# Patient Record
Sex: Male | Born: 1935 | Race: Black or African American | Hispanic: No | Marital: Married | Smoking: Former smoker
Health system: Southern US, Community
[De-identification: ages and names within clinical notes are randomized; demographics above are authoritative.]

## PROBLEM LIST (undated history)

## (undated) DIAGNOSIS — H409 Unspecified glaucoma: Secondary | ICD-10-CM

## (undated) DIAGNOSIS — E7800 Pure hypercholesterolemia, unspecified: Secondary | ICD-10-CM

## (undated) DIAGNOSIS — I10 Essential (primary) hypertension: Secondary | ICD-10-CM

## (undated) DIAGNOSIS — I639 Cerebral infarction, unspecified: Secondary | ICD-10-CM

## (undated) DIAGNOSIS — I2699 Other pulmonary embolism without acute cor pulmonale: Secondary | ICD-10-CM

## (undated) DIAGNOSIS — E119 Type 2 diabetes mellitus without complications: Secondary | ICD-10-CM

## (undated) DIAGNOSIS — F0390 Unspecified dementia without behavioral disturbance: Secondary | ICD-10-CM

## (undated) DIAGNOSIS — F4322 Adjustment disorder with anxiety: Secondary | ICD-10-CM

## (undated) DIAGNOSIS — R569 Unspecified convulsions: Secondary | ICD-10-CM

## (undated) DIAGNOSIS — Z950 Presence of cardiac pacemaker: Secondary | ICD-10-CM

## (undated) HISTORY — PX: AORTIC VALVE REPLACEMENT (AVR)/CORONARY ARTERY BYPASS GRAFTING (CABG): SHX5725

---

## 2005-01-03 ENCOUNTER — Other Ambulatory Visit: Payer: Self-pay

## 2005-01-03 ENCOUNTER — Inpatient Hospital Stay: Payer: Self-pay | Admitting: Internal Medicine

## 2005-06-04 ENCOUNTER — Inpatient Hospital Stay: Payer: Self-pay | Admitting: Surgery

## 2005-12-13 ENCOUNTER — Inpatient Hospital Stay: Payer: Self-pay | Admitting: Internal Medicine

## 2005-12-13 ENCOUNTER — Other Ambulatory Visit: Payer: Self-pay

## 2008-07-09 ENCOUNTER — Ambulatory Visit: Payer: Self-pay | Admitting: Internal Medicine

## 2008-08-02 ENCOUNTER — Ambulatory Visit: Payer: Self-pay | Admitting: Ophthalmology

## 2009-04-13 ENCOUNTER — Emergency Department: Payer: Self-pay | Admitting: Emergency Medicine

## 2012-11-23 ENCOUNTER — Emergency Department: Payer: Self-pay | Admitting: Emergency Medicine

## 2012-11-23 LAB — COMPREHENSIVE METABOLIC PANEL
Albumin: 3.7 g/dL (ref 3.4–5.0)
Alkaline Phosphatase: 53 U/L (ref 50–136)
Anion Gap: 6 — ABNORMAL LOW (ref 7–16)
BUN: 15 mg/dL (ref 7–18)
Bilirubin,Total: 0.4 mg/dL (ref 0.2–1.0)
Co2: 26 mmol/L (ref 21–32)
Creatinine: 1.1 mg/dL (ref 0.60–1.30)
EGFR (Non-African Amer.): >60
Glucose: 84 mg/dL (ref 65–99)
Potassium: 4 mmol/L (ref 3.5–5.1)
SGOT(AST): 19 U/L (ref 15–37)
SGPT (ALT): 28 U/L (ref 12–78)
Sodium: 138 mmol/L (ref 136–145)
Total Protein: 6.9 g/dL (ref 6.4–8.2)

## 2012-11-23 LAB — CBC
HCT: 41.7 % (ref 40.0–52.0)
HGB: 14 g/dL (ref 13.0–18.0)
MCHC: 33.6 g/dL (ref 32.0–36.0)
MCV: 87 fL (ref 80–100)
RDW: 14.2 % (ref 11.5–14.5)
WBC: 5.2 10*3/uL (ref 3.8–10.6)

## 2012-11-23 LAB — CK TOTAL AND CKMB (NOT AT ARMC)
CK, Total: 156 U/L (ref 35–232)
CK-MB: 1.7 ng/mL (ref 0.5–3.6)

## 2012-11-23 LAB — PRO B NATRIURETIC PEPTIDE: B-Type Natriuretic Peptide: 71 pg/mL (ref 0–125)

## 2012-11-23 LAB — TROPONIN I: Troponin-I: <0.02 ng/mL

## 2012-11-29 ENCOUNTER — Ambulatory Visit: Payer: Self-pay | Admitting: Cardiology

## 2013-02-06 ENCOUNTER — Encounter: Payer: Self-pay | Admitting: Surgery

## 2013-03-08 ENCOUNTER — Encounter: Payer: Self-pay | Admitting: Surgery

## 2013-03-19 ENCOUNTER — Ambulatory Visit: Payer: Self-pay | Admitting: Physician Assistant

## 2013-04-08 ENCOUNTER — Encounter: Payer: Self-pay | Admitting: Surgery

## 2013-07-25 ENCOUNTER — Ambulatory Visit: Payer: Self-pay | Admitting: Gastroenterology

## 2013-07-26 LAB — PATHOLOGY REPORT

## 2014-02-22 LAB — URINALYSIS, COMPLETE
BACTERIA: NONE SEEN
Bilirubin,UR: NEGATIVE
GLUCOSE, UR: NEGATIVE mg/dL (ref 0–75)
KETONE: NEGATIVE
LEUKOCYTE ESTERASE: NEGATIVE
NITRITE: NEGATIVE
PH: 5 (ref 4.5–8.0)
Protein: NEGATIVE
SQUAMOUS EPITHELIAL: NONE SEEN
Specific Gravity: 1.023 (ref 1.003–1.030)

## 2014-02-22 LAB — CBC WITH DIFFERENTIAL/PLATELET
Basophil #: 0.1 10*3/uL (ref 0.0–0.1)
Basophil %: 1.2 %
Eosinophil #: 0 10*3/uL (ref 0.0–0.7)
Eosinophil %: 1.1 %
HCT: 46.1 % (ref 40.0–52.0)
HGB: 14.6 g/dL (ref 13.0–18.0)
Lymphocyte #: 1.9 10*3/uL (ref 1.0–3.6)
Lymphocyte %: 42.6 %
MCH: 27.5 pg (ref 26.0–34.0)
MCHC: 31.7 g/dL — AB (ref 32.0–36.0)
MCV: 87 fL (ref 80–100)
Monocyte #: 0.4 x10 3/mm (ref 0.2–1.0)
Monocyte %: 8.4 %
NEUTROS ABS: 2.1 10*3/uL (ref 1.4–6.5)
NEUTROS PCT: 46.7 %
PLATELETS: 156 10*3/uL (ref 150–440)
RBC: 5.3 10*6/uL (ref 4.40–5.90)
RDW: 13.6 % (ref 11.5–14.5)
WBC: 4.5 10*3/uL (ref 3.8–10.6)

## 2014-02-22 LAB — HEPATIC FUNCTION PANEL A (ARMC)
ALK PHOS: 56 U/L
ALT: 28 U/L (ref 12–78)
Albumin: 3.9 g/dL (ref 3.4–5.0)
BILIRUBIN TOTAL: 0.7 mg/dL (ref 0.2–1.0)
Bilirubin, Direct: 0.1 mg/dL (ref 0.00–0.20)
SGOT(AST): 21 U/L (ref 15–37)
TOTAL PROTEIN: 7.6 g/dL (ref 6.4–8.2)

## 2014-02-22 LAB — BASIC METABOLIC PANEL
ANION GAP: 5 — AB (ref 7–16)
BUN: 10 mg/dL (ref 7–18)
CALCIUM: 8.9 mg/dL (ref 8.5–10.1)
Chloride: 104 mmol/L (ref 98–107)
Co2: 28 mmol/L (ref 21–32)
Creatinine: 0.96 mg/dL (ref 0.60–1.30)
EGFR (African American): >60
EGFR (Non-African Amer.): >60
Glucose: 85 mg/dL (ref 65–99)
Osmolality: 272 (ref 275–301)
POTASSIUM: 4.2 mmol/L (ref 3.5–5.1)
Sodium: 137 mmol/L (ref 136–145)

## 2014-02-22 LAB — LIPASE, BLOOD: LIPASE: 690 U/L — AB (ref 73–393)

## 2014-02-22 LAB — TROPONIN I: Troponin-I: <0.02 ng/mL

## 2014-02-22 LAB — MAGNESIUM: MAGNESIUM: 2 mg/dL

## 2014-02-23 ENCOUNTER — Inpatient Hospital Stay: Payer: Self-pay | Admitting: Family Medicine

## 2014-02-23 LAB — LIPID PANEL
CHOLESTEROL: 245 mg/dL — AB (ref 0–200)
HDL: 66 mg/dL — AB (ref 40–60)
LDL CHOLESTEROL, CALC: 165 mg/dL — AB (ref 0–100)
Triglycerides: 69 mg/dL (ref 0–200)
VLDL CHOLESTEROL, CALC: 14 mg/dL (ref 5–40)

## 2014-02-23 LAB — LIPASE, BLOOD: Lipase: 139 U/L (ref 73–393)

## 2014-02-23 LAB — HEMOGLOBIN A1C: Hemoglobin A1C: 6 % (ref 4.2–6.3)

## 2014-02-24 LAB — BASIC METABOLIC PANEL
Anion Gap: 7 (ref 7–16)
BUN: 14 mg/dL (ref 7–18)
CALCIUM: 8.6 mg/dL (ref 8.5–10.1)
Chloride: 104 mmol/L (ref 98–107)
Co2: 26 mmol/L (ref 21–32)
Creatinine: 0.95 mg/dL (ref 0.60–1.30)
EGFR (Non-African Amer.): >60
Glucose: 99 mg/dL (ref 65–99)
OSMOLALITY: 274 (ref 275–301)
Potassium: 3.5 mmol/L (ref 3.5–5.1)
SODIUM: 137 mmol/L (ref 136–145)

## 2014-02-24 LAB — CBC WITH DIFFERENTIAL/PLATELET
BASOS PCT: 0.1 %
Basophil #: 0 10*3/uL (ref 0.0–0.1)
EOS ABS: 0 10*3/uL (ref 0.0–0.7)
Eosinophil %: 0.1 %
HCT: 41.5 % (ref 40.0–52.0)
HGB: 13.5 g/dL (ref 13.0–18.0)
Lymphocyte #: 2.2 10*3/uL (ref 1.0–3.6)
Lymphocyte %: 23.1 %
MCH: 28.3 pg (ref 26.0–34.0)
MCHC: 32.6 g/dL (ref 32.0–36.0)
MCV: 87 fL (ref 80–100)
MONO ABS: 0.7 x10 3/mm (ref 0.2–1.0)
Monocyte %: 7.3 %
NEUTROS PCT: 69.4 %
Neutrophil #: 6.6 10*3/uL — ABNORMAL HIGH (ref 1.4–6.5)
Platelet: 162 10*3/uL (ref 150–440)
RBC: 4.77 10*6/uL (ref 4.40–5.90)
RDW: 13.8 % (ref 11.5–14.5)
WBC: 9.6 10*3/uL (ref 3.8–10.6)

## 2014-03-19 ENCOUNTER — Encounter: Payer: Self-pay | Admitting: Neurology

## 2014-03-25 DIAGNOSIS — I10 Essential (primary) hypertension: Secondary | ICD-10-CM | POA: Insufficient documentation

## 2014-03-25 DIAGNOSIS — R0602 Shortness of breath: Secondary | ICD-10-CM | POA: Insufficient documentation

## 2014-03-25 DIAGNOSIS — E785 Hyperlipidemia, unspecified: Secondary | ICD-10-CM | POA: Insufficient documentation

## 2014-04-08 ENCOUNTER — Encounter: Payer: Self-pay | Admitting: Neurology

## 2014-04-20 ENCOUNTER — Inpatient Hospital Stay: Payer: Self-pay

## 2014-04-20 LAB — CBC WITH DIFFERENTIAL/PLATELET
BASOS ABS: 0.1 10*3/uL (ref 0.0–0.1)
Basophil %: 1.3 %
EOS PCT: 1.3 %
Eosinophil #: 0.1 10*3/uL (ref 0.0–0.7)
HCT: 43.6 % (ref 40.0–52.0)
HGB: 14.8 g/dL (ref 13.0–18.0)
Lymphocyte #: 1.9 10*3/uL (ref 1.0–3.6)
Lymphocyte %: 42.9 %
MCH: 29.7 pg (ref 26.0–34.0)
MCHC: 33.9 g/dL (ref 32.0–36.0)
MCV: 88 fL (ref 80–100)
Monocyte #: 0.4 x10 3/mm (ref 0.2–1.0)
Monocyte %: 9.8 %
NEUTROS ABS: 1.9 10*3/uL (ref 1.4–6.5)
NEUTROS PCT: 44.7 %
PLATELETS: 188 10*3/uL (ref 150–440)
RBC: 4.98 10*6/uL (ref 4.40–5.90)
RDW: 14.1 % (ref 11.5–14.5)
WBC: 4.3 10*3/uL (ref 3.8–10.6)

## 2014-04-20 LAB — COMPREHENSIVE METABOLIC PANEL
ALK PHOS: 70 U/L
Albumin: 3.8 g/dL (ref 3.4–5.0)
Anion Gap: 8 (ref 7–16)
BILIRUBIN TOTAL: 0.6 mg/dL (ref 0.2–1.0)
BUN: 10 mg/dL (ref 7–18)
Calcium, Total: 8.8 mg/dL (ref 8.5–10.1)
Chloride: 101 mmol/L (ref 98–107)
Co2: 28 mmol/L (ref 21–32)
Creatinine: 1.14 mg/dL (ref 0.60–1.30)
EGFR (African American): >60
EGFR (Non-African Amer.): >60
GLUCOSE: 80 mg/dL (ref 65–99)
Osmolality: 272 (ref 275–301)
POTASSIUM: 4.2 mmol/L (ref 3.5–5.1)
SGOT(AST): 33 U/L (ref 15–37)
SGPT (ALT): 36 U/L (ref 12–78)
Sodium: 137 mmol/L (ref 136–145)
TOTAL PROTEIN: 7.2 g/dL (ref 6.4–8.2)

## 2014-04-20 LAB — URINALYSIS, COMPLETE
BILIRUBIN, UR: NEGATIVE
Bacteria: NONE SEEN
Blood: NEGATIVE
Glucose,UR: NEGATIVE mg/dL (ref 0–75)
Hyaline Cast: 2
KETONE: NEGATIVE
LEUKOCYTE ESTERASE: NEGATIVE
Nitrite: NEGATIVE
Ph: 5 (ref 4.5–8.0)
Protein: NEGATIVE
RBC,UR: NONE SEEN /HPF (ref 0–5)
Specific Gravity: 1.016 (ref 1.003–1.030)
Squamous Epithelial: <1

## 2014-04-20 LAB — LIPID PANEL
Cholesterol: 206 mg/dL — ABNORMAL HIGH (ref 0–200)
HDL Cholesterol: 64 mg/dL — ABNORMAL HIGH (ref 40–60)
LDL CHOLESTEROL, CALC: 121 mg/dL — AB (ref 0–100)
TRIGLYCERIDES: 104 mg/dL (ref 0–200)
VLDL Cholesterol, Calc: 21 mg/dL (ref 5–40)

## 2014-04-20 LAB — HEMOGLOBIN A1C: HEMOGLOBIN A1C: 6.3 % (ref 4.2–6.3)

## 2014-04-20 LAB — PROTIME-INR
INR: 1.1
Prothrombin Time: 14.1 secs (ref 11.5–14.7)

## 2014-04-20 LAB — TROPONIN I

## 2014-04-21 ENCOUNTER — Ambulatory Visit: Payer: Self-pay | Admitting: Neurology

## 2014-04-21 LAB — BASIC METABOLIC PANEL
Anion Gap: 5 — ABNORMAL LOW (ref 7–16)
BUN: 12 mg/dL (ref 7–18)
CALCIUM: 8.4 mg/dL — AB (ref 8.5–10.1)
CHLORIDE: 105 mmol/L (ref 98–107)
CREATININE: 1.1 mg/dL (ref 0.60–1.30)
Co2: 25 mmol/L (ref 21–32)
EGFR (African American): >60
GLUCOSE: 83 mg/dL (ref 65–99)
OSMOLALITY: 269 (ref 275–301)
Potassium: 3.9 mmol/L (ref 3.5–5.1)
SODIUM: 135 mmol/L — AB (ref 136–145)

## 2014-04-21 LAB — CBC WITH DIFFERENTIAL/PLATELET
BASOS ABS: 0 10*3/uL (ref 0.0–0.1)
Basophil %: 0.8 %
EOS ABS: 0.1 10*3/uL (ref 0.0–0.7)
EOS PCT: 1.3 %
HCT: 40.6 % (ref 40.0–52.0)
HGB: 13.4 g/dL (ref 13.0–18.0)
LYMPHS ABS: 2.5 10*3/uL (ref 1.0–3.6)
Lymphocyte %: 45.4 %
MCH: 29 pg (ref 26.0–34.0)
MCHC: 33 g/dL (ref 32.0–36.0)
MCV: 88 fL (ref 80–100)
MONOS PCT: 11.2 %
Monocyte #: 0.6 x10 3/mm (ref 0.2–1.0)
NEUTROS ABS: 2.3 10*3/uL (ref 1.4–6.5)
Neutrophil %: 41.3 %
PLATELETS: 176 10*3/uL (ref 150–440)
RBC: 4.62 10*6/uL (ref 4.40–5.90)
RDW: 14.3 % (ref 11.5–14.5)
WBC: 5.6 10*3/uL (ref 3.8–10.6)

## 2014-04-22 ENCOUNTER — Ambulatory Visit: Payer: Self-pay | Admitting: Neurology

## 2014-05-08 ENCOUNTER — Encounter: Payer: Self-pay | Admitting: Neurology

## 2014-05-17 DIAGNOSIS — IMO0002 Reserved for concepts with insufficient information to code with codable children: Secondary | ICD-10-CM | POA: Insufficient documentation

## 2014-05-17 DIAGNOSIS — I69393 Ataxia following cerebral infarction: Secondary | ICD-10-CM | POA: Insufficient documentation

## 2014-10-12 ENCOUNTER — Emergency Department: Payer: Self-pay | Admitting: Emergency Medicine

## 2014-10-13 LAB — CBC WITH DIFFERENTIAL/PLATELET
Basophil #: 0.1 10*3/uL (ref 0.0–0.1)
Basophil %: 1.1 %
Eosinophil #: 0.1 10*3/uL (ref 0.0–0.7)
Eosinophil %: 1 %
HCT: 45.3 % (ref 40.0–52.0)
HGB: 14.7 g/dL (ref 13.0–18.0)
LYMPHS ABS: 2 10*3/uL (ref 1.0–3.6)
Lymphocyte %: 38 %
MCH: 29.1 pg (ref 26.0–34.0)
MCHC: 32.4 g/dL (ref 32.0–36.0)
MCV: 90 fL (ref 80–100)
Monocyte #: 0.6 x10 3/mm (ref 0.2–1.0)
Monocyte %: 11.4 %
Neutrophil #: 2.5 10*3/uL (ref 1.4–6.5)
Neutrophil %: 48.5 %
Platelet: 160 10*3/uL (ref 150–440)
RBC: 5.05 10*6/uL (ref 4.40–5.90)
RDW: 14.4 % (ref 11.5–14.5)
WBC: 5.2 10*3/uL (ref 3.8–10.6)

## 2014-10-13 LAB — BASIC METABOLIC PANEL
Anion Gap: 7 (ref 7–16)
BUN: 16 mg/dL (ref 7–18)
Calcium, Total: 8.7 mg/dL (ref 8.5–10.1)
Chloride: 103 mmol/L (ref 98–107)
Co2: 28 mmol/L (ref 21–32)
Creatinine: 1.34 mg/dL — ABNORMAL HIGH (ref 0.60–1.30)
EGFR (African American): >60
GFR CALC NON AF AMER: 55 — AB
Glucose: 92 mg/dL (ref 65–99)
OSMOLALITY: 277 (ref 275–301)
Potassium: 4.5 mmol/L (ref 3.5–5.1)
SODIUM: 138 mmol/L (ref 136–145)

## 2014-10-13 LAB — TROPONIN I

## 2015-03-01 NOTE — Consult Note (Signed)
PATIENT NAME:  Anthony Thornton, Anthony Thornton MR#:  [id number] DATE OF BIRTH:  Dec 08, 1935  DATE OF CONSULTATION:  04/21/2014  CONSULTING PHYSICIAN:  Pauletta Browns, MD  REASON FOR CONSULTATION:  Rule out stroke.   REPORT OF CONSULTATION: This is a 79 year old, African-American male with past medical history of coronary artery disease, status post bypass graft, dementia, recent stroke in 2015, found to have a right MCA ischemic infarct. Upon CAT scan imaging, patient has bilateral chronic infarcts. Presenting with on-and-off left upper extremity weakness lasting 7 to 12 days. The weakness comes and goes, that affects only the left upper extremity. No facial droop. No visual changes. No real weakness of the left lower extremities. Denies any fever, chills, or recent infections. Admitted for stroke work-up.   PAST MEDICAL HISTORY: History of stroke in April 2015, with left MCA infarct, hypertension, history of EtOH use, GERD, hiatal hernia, coronary artery disease, hyperlipidemia, diverticulosis, diet-controlled diabetes, and early onset dementia.   PAST SURGICAL HISTORY: Tonsillectomy, colon polypectomy, coronary artery bypass grafting.   MEDICATIONS: Include aspirin 325 at home, Aricept, atorvastatin, Lasix, and lisinopril.   SOCIAL HISTORY: Lives at home with his wife. No smoking. No EtOH or drug use.   FAMILY HISTORY: Significant for heart disease, which runs in his family.   DATA: Laboratory workup has been reviewed. CAT scan of the head:  No acute intracranial abnormality. Chronic infarcts bilateral have been found. Negative ultrasound of carotids. No significant hemodynamic stenosis.   REVIEW OF SYSTEMS:   CONSTITUTIONAL:  No fever. No chills.  EYES: No blurred vision or double vision.  ENT: No tinnitus or ear pain.  RESPIRATORY: No cough. No wheeze.  CARDIOVASCULAR: No chest pain or orthopnea.  GENITOURINARY: No nausea, no vomiting.  ENDOCRINE: No polyuria or nocturia. NEUROLOGIC:  Presenting  with left upper extremity weakness.   VITAL SIGNS: Include a temperature of 97.8, pulse 69, respirations 18, blood pressure 157/84.   NEUROLOGIC EVALUATION: The patient is awake, alert to place, and could tell me his name. Could not tell me the date, time, or location. Could not tell me how many quarters are in $1.50. Speech appears to be fluent. No signs of aphasia or dysarthria. Extraocular movements are intact. Pupils equal, round, reactive. Facial sensation intact. Facial motor intact. Tongue is midline. Uvula elevates symmetrically. Shoulder shrug intact. Motor strength appears to be 5/5 in right upper extremity, left upper extremity proximally is 4+/5, distally is 4/5. There is a definite giveaway weakness in the left upper extremity. Left lower extremity is equal to the right lower extremity. Appears to be 4+/5 in all muscle groups. Reflexes are diminished throughout. Sensation intact to light touch and temperature. Coordination: Finger-to-nose intact. Gait could not be assessed.   IMPRESSION: A 79 year old male with history of coronary artery disease, early onset Alzheimer's, on Aricept, was on aspirin, presenting with a 7 to 12-day history of left upper extremity weakness. Apparently, as per family members who are bedside, state that it comes and goes. On examination, there is a lot of giveaway weakness in the left upper extremity. No facial abnormalities, no facial changes, and does not appear to affect his left lower extremity or any sensory deficits. The patient does appear to have high risk of strokes and previous stroke in April 2015. States he takes aspirin daily, status post change in Aggrenox, which I agree on.   PLAN: Continue Aggrenox. Awaiting MRI of the brain. In order for this to be an infarct, the patient has to have affected just  specific motor cortex that only would affect the left arm, or motor thalamic nuclei versus specific areas of posterior limb of internal capsule. Physical  therapy, occupational therapy.   Case discussed with family at bedside.   Thank you, it was a pleasure seeing this patient.     ____________________________ Pauletta BrownsYuriy Dvontae Ruan, MD yz:mr D: 04/21/2014 15:43:54 ET T: 04/21/2014 18:25:08 ET JOB#: [id number]  cc: Pauletta BrownsYuriy Caylah Plouff, MD, <Dictator> Pauletta BrownsYURIY Yazlyn Wentzel MD ELECTRONICALLY SIGNED 04/25/2014 17:21

## 2015-03-01 NOTE — Discharge Summary (Signed)
PATIENT NAME:  Anthony Thornton, Anthony Thornton MR#:  [id number] DATE OF BIRTH:  10-07-1936  DATE OF ADMISSION:  02/23/2014 DATE OF DISCHARGE:  02/24/2014  DISCHARGE DIAGNOSES: 1.  Acute cerebrovascular accident.  2.  Hyperlipidemia.  3.  Hypertension. 4.  Dementia.   DISCHARGE MEDICATIONS: 1.  Donepezil 10 mg p.o. daily.  2.  Frusemide 20 mg p.o. daily as needed for swelling.  3.  Aspirin 325 mg p.o. daily.  4.  Lisinopril 5 mg p.o. daily.  5.  Atorvastatin 20 mg p.o. daily.   CONSULTS: None.   PROCEDURES:  None.  LABORATORY, DIAGNOSTIC AND RADIOLOGICAL DATA:  MRI of the brain showed acute on chronic posterior right MCA territory ischemia with scattered small cortical and subcortical acute infarcts. Ultrasound of the carotids showed less than 50% stenosis of the internal carotid artery and positive plaque formation in the external carotid arteries. Echo was normal. LDL was 165. A1c 6%. Sodium 137, potassium 3.5, creatinine 0.95. White blood cell count 9.6, hemoglobin 13.5, platelets 162.   BRIEF HOSPITAL COURSE:  1.  Acute CVA. The patient initially came in with ataxia and with concerns for acute CVA on CT of the head. He was admitted, evaluated by physical therapy, who did not think he needed any home resources or therapy. MRI was performed as stated above, consistent with acute infarct. He had been off his aspirin for a period of time; therefore, we restarted his aspirin but at a higher dose of 325 mg. Did change his statin from pravastatin to atorvastatin for better coverage with an LDL of 165. Our goals is going to be less than 100. Also changed his blood pressure medication over to lisinopril for better coverage with history of CVA.    2.  Other pertinent medical problems are stable.   DISPOSITION: He is in stable condition to be discharged to home.   DISCHARGE INSTRUCTIONS:  Low-carb diet. Follow up with Dr. Sampson GoonFitzgerald within 10 days.   ____________________________ Marisue IvanKanhka Hailynn Slovacek,  MD kl:cs D: 02/24/2014 09:34:17 ET T: 02/24/2014 18:03:27 ET JOB#: [id number]  cc: Marisue IvanKanhka Clista Rainford, MD, <Dictator> Marisue IvanKANHKA Gene Colee MD ELECTRONICALLY SIGNED 02/28/2014 10:45

## 2015-03-01 NOTE — Discharge Summary (Signed)
PATIENT NAME:  Anthony Thornton, [name] MR#:  [id number] DATE OF BIRTH:  10-11-1936  DATE OF ADMISSION:  04/20/2014 DATE OF DISCHARGE:  04/23/2014  DISCHARGE DIAGNOSES:  1.1 Acute cerebrovascular accident.  2. Hypertension.  3. Dementia.  4. Coronary artery disease.  HISTORY OF PRESENT ILLNESS: Please see initial history and physical for details.  1. Briefly, this is a 79 year old gentleman with a history of dementia, coronary artery disease status post CABG and a recent TIA in April 2015 who was admitted with worsening left arm weakness. CT of the head was done. The patient was seen by Neurology. MRI did show an acute stroke. He was started on Aggrenox in place of aspirin. He continued on his statin and the dose was increased.  2. Coronary artery disease: Continued on blood pressure and lipid control. He is not on a beta blocker due to bradycardia. Aspirin 81 mg was added to the Aggrenox for coronary artery disease.  3. Hypertension: Blood pressure remained stable on lisinopril.  4. Dementia: This was stable on Aricept.  5. Debility: The patient was seen by Physical Therapy who recommended home  with home health.  DISCHARGE MEDICATIONS: [facility]. New medications include aspirin 81 mg in place of 325 and Aggrenox 25/200 one tablet twice a day. His atorvastatin was increased from 20 to 40 mg.  DISPOSITION: Discharge was to home with home health.   DISCHARGE DIET: Low sodium, regular consistency.  FOLLOWUP: The patient will follow up with Dr. Sampson GoonFitzgerald in 1 to 2 weeks.   This discharge took 35 minutes.    ____________________________ Stann Mainlandavid P. Sampson GoonFitzgerald, MD dpf:es D: 05/02/2014 13:59:42 ET T: 05/02/2014 15:22:30 ET JOB#: [id number]  cc: Stann Mainlandavid P. Sampson GoonFitzgerald, MD, <Dictator> DAVID Sampson GoonFITZGERALD MD ELECTRONICALLY SIGNED 05/27/2014 21:09

## 2015-03-01 NOTE — H&P (Signed)
PATIENT NAME:  Anthony Thornton, Anthony Thornton MR#:  [id number] DATE OF BIRTH:  1936/07/09  DATE OF ADMISSION:  04/20/2014  ADMITTING PHYSICIAN: Gladstone Lighter, MD  PRIMARY CARE PHYSICIAN: Dr. Ola Spurr  CHIEF COMPLAINT: Left arm weakness.   HISTORY OF PRESENT ILLNESS: Anthony Thornton is a 79 year old, African-American male with past medical history significant for coronary artery disease, status post bypass graft surgery, dementia, recent admission in April 2015 for ataxia, and was diagnosed to have acute CVA at the time, involving the right MCA territory posterior ischemia. The patient went home with outpatient physical therapy at the time and doing fine up until 3 weeks ago. He started noticing fluctuating weakness of his left arm. He has chronic left foot issues sometimes, and gives trouble to him while walking. However, he never had left arm weakness. His wife has been asking him to see a physician, but he said since the weakness was getting better intermittently, he just thought it would go away. However, since yesterday, patient's weakness of the left arm was more persistent. He was unable to hold anything, and presented to the ER today. Denies any fevers, chills, or recent illnesses. CT of the head done here shows no acute intracranial abnormalities, old bilateral infarcts noted. He is being admitted for possible CVA and needs further workup.   PAST MEDICAL HISTORY: 1.  History of recent CVA, presenting with ataxia.  2.  Hypertension.  3.  History of alcohol abuse.  4.  Gastroesophageal reflux disease.   5.  Hiatal hernia. 6.  CAD, status post bypass graft surgery. 7.  Hyperlipemia. 8.  Diverticulosis. 9.  Diet-controlled diabetes mellitus.  10.  Dementia.   PAST SURGICAL HISTORY: 1.  Tonsillectomy.  2.  Colon polypectomy. 3.  Coronary artery bypass graft surgery.   ALLERGIES TO MEDICATIONS: No known drug allergies.   CURRENT HOME MEDICATIONS:  1.  Aspirin 325 mg p.o. daily.  2.  Aricept 10 mg  p.o. daily.  3.  Atorvastatin 20 mg p.o. daily.  4.  Lasix 20 mg p.o. daily p.r.n. for swelling.  5.  Lisinopril 5 mg p.o. daily.   SOCIAL HISTORY: Lives at home with his wife. No smoking, alcohol, or drug abuse.   FAMILY HISTORY: Significant for heart disease running in the family.   REVIEW OF SYSTEMS:  CONSTITUTIONAL: No fever, fatigue, or weakness.  EYES: No blurred vision, double vision, inflammation or  glaucoma.  ENT: No tinnitus, ear pain, hearing loss, epistaxis, or discharge.  RESPIRATORY: No cough, wheeze, hemoptysis, or COPD.   CARDIOVASCULAR: No chest pain, orthopnea, edema, arrhythmia, palpitations, or syncope.  GASTROINTESTINAL: No nausea, vomiting, diarrhea, abdominal pain, hematemesis, or melena.  GENITOURINARY: No dysuria, hematochezia, renal calculus, frequency, or incontinence.  ENDOCRINE: No polyuria, nocturia, thyroid problems, heat or cold intolerance.  HEMATOLOGY: No anemia, easy bruising or bleeding.  SKIN: No acne, rash, or lesions.  MUSCULOSKELETAL: No neck, back, shoulder pain. No arthritis or gout. NEUROLOGIC: Presenting with left arm weakness, possible history of CVA and ataxia. No seizures.  PSYCHOLOGICAL: No anxiety, insomnia, or depression.   PHYSICAL EXAMINATION: VITAL SIGNS: Temperature 98.2 degrees Fahrenheit, pulse 62, respirations 20, blood pressure 155/72, pulse ox 98% on room air.  GENERAL: A well-built, well-nourished male lying in bed, not in any acute distress.  HEENT: Normocephalic, atraumatic. Pupils equal, round, reacting to light. Anicteric sclerae. Extraocular movements intact. Oropharynx clear, without erythema, mass, or exudates. NECK:  Supple. No thyromegaly, JVD, or carotid bruits. No lymphadenopathy. LUNGS: Moving air bilaterally. No wheeze or crackles.  No use of accessory muscles for breathing.  CARDIOVASCULAR: S1, S2. Regular rate and rhythm. A 3/6 systolic murmur present. No rubs or gallops.  ABDOMEN: Soft, nontender, nondistended.  No hepatosplenomegaly. Normal bowel sounds.  EXTREMITIES: No pedal edema. No clubbing or cyanosis. There are 2+ dorsalis pedis pulses palpable bilaterally.  SKIN: No acne, rash, or lesions.  LYMPHATICS: No cervical or inguinal lymphadenopathy.  NEUROLOGIC: No facial droop. Cranial nerves II through XII remain grossly intact. Motor strength 5/5 in both lower extremities and right upper extremity. Left upper extremity strength is 3/5, with decreased hand grip. Sensation is intact all over. Cerebellar function tests are intact.  PSYCHOLOGIC:  The patient is awake, alert, oriented x 3.   LAB DATA: WBC 4.3, hemoglobin 14.8, hematocrit 43.6, platelet count 188. Sodium 137, potassium 2.2, chloride 101, bicarb 28, BUN 10, creatinine 1.1, glucose 80, calcium 8.8. ALT 36, AST 33, alk phos 70, total bilirubin 0.6, albumin 3.8. Urinalysis negative for any infection. INR 1.1. Troponin less than 0.02. CT of the head showing old right posterior MCA infarct, old left frontal infarct, both are stable, no acute intracranial abnormality noted.   ASSESSMENT AND PLAN: This is a 79 year old male with past medical history significant for hypertension, dementia, coronary artery disease, status post bypass graft surgery, recent admission April 2015 for acute cerebrovascular accident, presenting with left arm weakness.   1.  Left arm weakness. Likely CVA. Fluctuating symptoms for the last week. Did not see his physician. Now more persistent symptoms since yesterday. CT of the head is negative. Admit to telemetry, get MRI of the brain. No need to repeat echo Doppler, since recently done. Echo showing mildly dilated atria, EF is normal, no acute wall motion abnormalities noted. Carotid Doppler showing less than 50% atherosclerotic plaque, but no stenosis noted. Neurology has been consulted, especially if this is recurrent CVA. Will change his aspirin to Aggrenox. Telemetry monitoring to rule out AFib with recurrent infarcts  especially. Lipid profile has been ordered, and increase his statin dose.   2.  Coronary artery disease, status post bypass graft surgery. Appears stable. He is on Aggrenox. Sinus brady, so not on beta blocker. Continue his statin.   3.  Dementia. Continue Aricept.   4.  Hypertension, on lisinopril.   CODE STATUS: FULL CODE.   TIME SPENT ON ADMISSION: 50 minutes.     ____________________________ Gladstone Lighter, MD rk:mr D: 04/20/2014 17:24:11 ET T: 04/20/2014 18:54:08 ET JOB#: [id number]  cc: Gladstone Lighter, MD, <Dictator> Cheral Marker. Ola Spurr, MD  Gladstone Lighter MD ELECTRONICALLY SIGNED 04/21/2014 14:33

## 2015-03-01 NOTE — H&P (Signed)
PATIENT NAME:  Anthony Thornton, Anthony Thornton#:  [id number] DATE OF BIRTH:  1936/05/05  DATE OF ADMISSION:  02/22/2014  PRIMARY CARE PROVIDER: Stann Mainlandavid P. Sampson GoonFitzgerald, MD  CHIEF COMPLAINT: Gait abnormalities with losing balance.   HISTORY OF PRESENT ILLNESS: A 79 year old male patient with history of CAD, status post CABG, diet-controlled diabetes, TIA, hypertension, presents to the Emergency Room complaining of 2 days of problems with his balance. The patient normally goes out for  walk. Yesterday when he returned from the walk, he mentioned to his wife that he was having problems with his balance and almost fell down, but did not have any falls. Today, he was trying to go to the mailbox. Wife noticed that he was having problem with his left foot and brought him to the Emergency Room. The patient has not had any change with his vision, hearing. No other focal neurological deficits.   The patient does have dementia, is a poor historian. He just smiles and does not answer to most of the questions.   PAST MEDICAL HISTORY: 1.  TIA.  2.  Hypertension.  3.  History of alcohol abuse.  4.  GERD. 5.  Hiatal hernia.  6.  Abnormal EKG with normal stress test in the past.  7.  CAD, status post CABG.  8.  Hyperlipidemia.  9.  MRSA.  10.  Post tonsillectomy.  11.  Colon polyps and diverticulosis.  12.  Diabetes mellitus type 2, diet controlled.  13.  Dementia.   PAST SURGICAL HISTORY: Tonsillectomy, CABG, colon polypectomy.   HOME MEDICATIONS: 1.  Aspirin 81 mg daily, stopped 3 weeks prior as he ran out.  2.  Aricept 10 mg daily.  3.  Lasix 20 mg daily.  4.  Pravastatin 10 mg daily.  5.  Atenolol 25 mg daily.   ALLERGIES: No known drug allergies.   SOCIAL HISTORY: The patient is married, lives with his wife and his oldest son. Retired from [employer] business. Does not smoke. No alcohol, but had alcohol abuse in the past. Smoked in the past, but quit 20 years back.   FAMILY HISTORY: Positive for heart  disease and blood pressure.   REVIEW OF SYSTEMS:    CONSTITUTIONAL: Does not complain of any fatigue, weakness.  EYES: No blurred vision, pain, redness. EARS, NOSE, THROAT: No tinnitus, ear pain, hearing loss.  RESPIRATORY: No cough, wheeze, hemoptysis. CARDIOVASCULAR: No chest pain, orthopnea, edema. GASTROINTESTINAL: No nausea, vomiting, diarrhea, or abdominal pain.  GENITOURINARY: No dysuria, hematuria, or frequency.  ENDOCRINE: No polyuria, nocturia, or thyroid problems.  HEMATOLOGIC AND LYMPHATIC: No anemia, easy bruising, bleeding. INTEGUMENTARY: No acne, rash, lesion.  MUSCULOSKELETAL: No back pain or arthritis.  NEUROLOGICAL: Has problems with his balance.  PSYCHIATRIC: No anxiety or depression. Does have dementia.   PHYSICAL EXAMINATION: VITAL SIGNS: Temperature 98, pulse of 40 to 55, respirations 18, blood pressure 161/74, saturating 97% on room air.  GENERAL: Obese African American male patient lying in bed, seems comfortable, conversational, cooperative with exam.  PSYCHIATRIC: Alert, oriented to place and person but not to time.  HEENT: Atraumatic, normocephalic. Oral mucosa moist and pink. External ears and nose normal. No pallor or icterus. Pupils bilaterally equal and reactive to light.  NECK: Supple. No thyromegaly or palpable lymph nodes. Trachea midline. No carotid bruit, JVD.  CARDIOVASCULAR: S1, S2, bradycardic without any murmurs. Peripheral pulses 2+. No edema.  RESPIRATORY: Normal work of breathing, but has decreased air entry and bilateral wheezing.  GASTROINTESTINAL: Soft abdomen, nontender. Bowel signs are present.  No organomegaly palpable. GENITOURINARY: No CVA tenderness or bladder distention.  SKIN: Warm and dry. No petechiae, rash, ulcers.  MUSCULOSKELETAL: No joint swelling, redness, effusion of large joints. Normal muscle tone.  NEUROLOGICAL: Motor strength 5/5 in upper extremities. Has left-sided pronator drift. No facial droop. Cranial nerves II through  XII intact. Finger-nose test normal. Romberg is negative. Gait not tested.  LYMPHATIC: No cervical lymphadenopathy.   LABORATORY, DIAGNOSTIC, AND RADIOLOGICAL DATA: Glucose 85, BUN 10, creatinine 0.96, sodium 137, potassium 4.2, lipase of 690.   AST, ALT, alkaline phosphatase, bilirubin normal.   Troponin less than 0.02.   WBC 4.5, hemoglobin 14.6, platelets of 156.   Urinalysis shows no bacteria.   EKG shows sinus bradycardia at rate of 50 with left axis deviation, right bundle branch block, and T wave inversions. This is unchanged from prior EKG available.   CT scan of the head without contrast shows mid left frontal lobe infarct, acute versus chronic. An old stable infarct in the right parietal lobe. Prior small infarcts in caudate nucleus. Atrophy and small vessel disease stable.   ASSESSMENT AND PLAN: 1.  Acute onset of problems with gait and balance. The patient does have some pronator drift on the left, but his CT suggests that he has possible acute cerebrovascular accident on the frontal left side. Will get an MRI, carotid, and echo. The patient will get neuro checks q.4 hours. Aspirin, statin. Further management per MRI results.  2.  Acute bronchitis. The patient's wife mentions that he has been huffing and puffing for the past few days with walking, although he does not complain of any shortness of breath with his poor history from dementia. He does have decreased air entry and wheezing. Will get a chest x-ray. Start him on some prednisone and DuoNebs.  3.  Sinus bradycardia. The patient is on atenolol. He does have a history of coronary artery disease, but bradycardia has gone as low as 48. We will hold the atenolol at this time. Also, we would like some permissive hypertension with his stroke.  4.  Diet-controlled diabetes mellitus. Put him on sliding scale insulin.  5.  Deep venous thrombosis prophylaxis with Lovenox.   CODE STATUS: Full code.   TIME SPENT: Today on this case  was 45 minutes.    ____________________________ Molinda Bailiff Dywane Peruski, MD srs:jcm D: 02/22/2014 19:04:34 ET T: 02/22/2014 21:06:15 ET JOB#: [id number]  cc: Wardell Heath R. Karlos Scadden, MD, <Dictator> Darlin Priestly. Lady Gary, MD Stann Mainland. Sampson Goon, MD Orie Fisherman MD ELECTRONICALLY SIGNED 03/02/2014 11:38

## 2015-06-25 DIAGNOSIS — Z8673 Personal history of transient ischemic attack (TIA), and cerebral infarction without residual deficits: Secondary | ICD-10-CM | POA: Insufficient documentation

## 2017-01-06 ENCOUNTER — Emergency Department
Admission: EM | Admit: 2017-01-06 | Discharge: 2017-01-06 | Disposition: A | Discharge status: Home / Self Care | Payer: Medicare Other | Attending: Emergency Medicine | Admitting: Emergency Medicine

## 2017-01-06 ENCOUNTER — Encounter: Payer: Self-pay | Admitting: Emergency Medicine

## 2017-01-06 ENCOUNTER — Emergency Department: Payer: Medicare Other

## 2017-01-06 DIAGNOSIS — Z87891 Personal history of nicotine dependence: Secondary | ICD-10-CM | POA: Insufficient documentation

## 2017-01-06 DIAGNOSIS — R4689 Other symptoms and signs involving appearance and behavior: Secondary | ICD-10-CM

## 2017-01-06 DIAGNOSIS — Z79899 Other long term (current) drug therapy: Secondary | ICD-10-CM | POA: Insufficient documentation

## 2017-01-06 DIAGNOSIS — E119 Type 2 diabetes mellitus without complications: Secondary | ICD-10-CM | POA: Diagnosis not present

## 2017-01-06 DIAGNOSIS — I10 Essential (primary) hypertension: Secondary | ICD-10-CM | POA: Diagnosis not present

## 2017-01-06 DIAGNOSIS — F919 Conduct disorder, unspecified: Secondary | ICD-10-CM | POA: Diagnosis not present

## 2017-01-06 DIAGNOSIS — R4182 Altered mental status, unspecified: Secondary | ICD-10-CM | POA: Diagnosis present

## 2017-01-06 DIAGNOSIS — Z7982 Long term (current) use of aspirin: Secondary | ICD-10-CM | POA: Insufficient documentation

## 2017-01-06 HISTORY — DX: Type 2 diabetes mellitus without complications: E11.9

## 2017-01-06 HISTORY — DX: Unspecified dementia, unspecified severity, without behavioral disturbance, psychotic disturbance, mood disturbance, and anxiety: F03.90

## 2017-01-06 HISTORY — DX: Essential (primary) hypertension: I10

## 2017-01-06 HISTORY — DX: Pure hypercholesterolemia, unspecified: E78.00

## 2017-01-06 LAB — URINALYSIS, COMPLETE (UACMP) WITH MICROSCOPIC
Bacteria, UA: NONE SEEN
Bilirubin Urine: NEGATIVE
GLUCOSE, UA: NEGATIVE mg/dL
Ketones, ur: NEGATIVE mg/dL
Leukocytes, UA: NEGATIVE
NITRITE: NEGATIVE
PH: 6 (ref 5.0–8.0)
Protein, ur: NEGATIVE mg/dL
SPECIFIC GRAVITY, URINE: 1.012 (ref 1.005–1.030)

## 2017-01-06 LAB — CBC
HEMATOCRIT: 43.2 % (ref 40.0–52.0)
HEMOGLOBIN: 14.7 g/dL (ref 13.0–18.0)
MCH: 29.4 pg (ref 26.0–34.0)
MCHC: 34.2 g/dL (ref 32.0–36.0)
MCV: 86.2 fL (ref 80.0–100.0)
Platelets: 236 10*3/uL (ref 150–440)
RBC: 5.01 MIL/uL (ref 4.40–5.90)
RDW: 14.2 % (ref 11.5–14.5)
WBC: 7.7 10*3/uL (ref 3.8–10.6)

## 2017-01-06 LAB — COMPREHENSIVE METABOLIC PANEL
ALBUMIN: 4.2 g/dL (ref 3.5–5.0)
ALK PHOS: 46 U/L (ref 38–126)
ALT: 20 U/L (ref 17–63)
ANION GAP: 9 (ref 5–15)
AST: 27 U/L (ref 15–41)
BILIRUBIN TOTAL: 0.6 mg/dL (ref 0.3–1.2)
BUN: 15 mg/dL (ref 6–20)
CO2: 25 mmol/L (ref 22–32)
Calcium: 9.3 mg/dL (ref 8.9–10.3)
Chloride: 102 mmol/L (ref 101–111)
Creatinine, Ser: 1.31 mg/dL — ABNORMAL HIGH (ref 0.61–1.24)
GFR calc Af Amer: 58 mL/min — ABNORMAL LOW (ref >60)
GFR, EST NON AFRICAN AMERICAN: 50 mL/min — AB (ref >60)
GLUCOSE: 104 mg/dL — AB (ref 65–99)
POTASSIUM: 4.1 mmol/L (ref 3.5–5.1)
Sodium: 136 mmol/L (ref 135–145)
TOTAL PROTEIN: 7.5 g/dL (ref 6.5–8.1)

## 2017-01-06 MED ORDER — LORAZEPAM 0.5 MG PO TABS
0.5000 mg | ORAL_TABLET | Freq: Once | ORAL | Status: AC
Start: 2017-01-06 — End: 2017-01-06
  Administered 2017-01-06: 0.5 mg via ORAL
  Filled 2017-01-06: qty 1

## 2017-01-06 MED ORDER — LORAZEPAM 0.5 MG PO TABS: 0.5000 mg | ORAL_TABLET | Freq: Three times a day (TID) | ORAL | 0 refills | Status: DC | PRN

## 2017-01-06 NOTE — Discharge Instructions (Signed)
Please seek medical attention for any high fevers, chest pain, shortness of breath, change in behavior, persistent vomiting, bloody stool or any other new or concerning symptoms.  

## 2017-01-06 NOTE — ED Provider Notes (Signed)
[facility] Emergency Department Provider Note  ____________________________________________   I have reviewed the triage vital signs and the nursing notes.   HISTORY  Chief Complaint Altered Mental Status   History limited by: Dementia, history obtained primarily from wife   HPI Anthony Thornton is a 81 y.o. male who presents to the emergency department today because of concerns for worsening behavior. Wife says that for the past 23 days she has noticed that the patient has been more agitated. In addition he has not been sleeping as well. She also states that the patient has been wandering out to the neighbor's house and knocked on the neighbor's door. The patient has a history of dementia and is on Aricept. No recent change in medication dosage. Has not noticed any signs of illness. Patient does not have any fevers, cough shortness breath nausea vomiting or diarrhea.   Past Medical History:  Diagnosis Date  . Dementia   . Diabetes mellitus without complication (HCC)   . Hypercholesteremia   . Hypertension     There are no active problems to display for this patient.   Past Surgical History:  Procedure Laterality Date  . AORTIC VALVE REPLACEMENT (AVR)/CORONARY ARTERY BYPASS GRAFTING (CABG)      Prior to Admission medications   Medication Sig Start Date End Date Taking? Authorizing Provider  aspirin EC 81 MG tablet Take 81 mg by mouth daily.   Yes Historical Provider, MD  atenolol (TENORMIN) 25 MG tablet Take 25 mg by mouth daily. 01/01/17  Yes Historical Provider, MD  clopidogrel (PLAVIX) 75 MG tablet Take 75 mg by mouth daily. 01/05/17  Yes Historical Provider, MD  donepezil (ARICEPT) 10 MG tablet Take 10 mg by mouth daily. 01/03/17  Yes Historical Provider, MD  furosemide (LASIX) 20 MG tablet Take 20 mg by mouth daily. 10/28/16  Yes Historical Provider, MD  lisinopril (PRINIVIL,ZESTRIL) 5 MG tablet Take 5 mg by mouth daily. 11/29/16  Yes Historical  Provider, MD  pravastatin (PRAVACHOL) 80 MG tablet Take 80 mg by mouth daily. 01/05/17  Yes Historical Provider, MD    Allergies Patient has no known allergies.  No family history on file.  Social History Social History  Substance Use Topics  . Smoking status: Former Games developer  . Smokeless tobacco: Never Used  . Alcohol use Not on file    Review of Systems  Constitutional: Negative for fever. Cardiovascular: Negative for chest pain. Respiratory: Negative for shortness of breath. Gastrointestinal: Negative for abdominal pain, vomiting and diarrhea. Neurological: Negative for headaches, focal weakness or numbness.  10-point ROS otherwise negative.  ____________________________________________   PHYSICAL EXAM:  VITAL SIGNS: ED Triage Vitals  Enc Vitals Group     BP 01/06/17 1917 (!) 173/85     Pulse Rate 01/06/17 1917 72     Resp 01/06/17 1917 18     Temp 01/06/17 1917 98.2 F (36.8 C)     Temp Source 01/06/17 1917 Oral     SpO2 01/06/17 1917 97 %     Weight 01/06/17 1918 212 lb (96.2 kg)     Height 01/06/17 1918 5\' 11"  (1.803 m)   Constitutional: Awake and alert. Not oriented to events. Well appearing and in no distress. Eyes: Conjunctivae are normal. Normal extraocular movements. ENT   Head: Normocephalic and atraumatic.   Nose: No congestion/rhinnorhea.   Mouth/Throat: Mucous membranes are moist.   Neck: No stridor. Hematological/Lymphatic/Immunilogical: No cervical lymphadenopathy. Cardiovascular: Normal rate, regular rhythm.  No murmurs, rubs, or gallops.  Respiratory: Normal respiratory effort without tachypnea nor retractions. Breath sounds are clear and equal bilaterally. No wheezes/rales/rhonchi. Gastrointestinal: Soft and non tender. No rebound. No guarding.  Genitourinary: Deferred Musculoskeletal: Normal range of motion in all extremities. No lower extremity edema. Neurologic:  Normal speech and language. No gross focal neurologic deficits  are appreciated.  Skin:  Skin is warm, dry and intact. No rash noted.  ____________________________________________    LABS (pertinent positives/negatives)  Labs Reviewed  COMPREHENSIVE METABOLIC PANEL - Abnormal; Notable for the following:       Result Value   Glucose, Bld 104 (*)    Creatinine, Ser 1.31 (*)    GFR calc non Af Amer 50 (*)    GFR calc Af Amer 58 (*)    All other components within normal limits  URINALYSIS, COMPLETE (UACMP) WITH MICROSCOPIC - Abnormal; Notable for the following:    Color, Urine YELLOW (*)    APPearance CLEAR (*)    Hgb urine dipstick SMALL (*)    Squamous Epithelial / LPF 0-5 (*)    All other components within normal limits  CBC     ____________________________________________   EKG  None  ____________________________________________    RADIOLOGY  CT head IMPRESSION:  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    Changes of chronic subdural hygroma. No acute subdural hematoma is  seen.   ____________________________________________   PROCEDURES  Procedures  ____________________________________________   INITIAL IMPRESSION / ASSESSMENT AND PLAN / ED COURSE  Pertinent labs & imaging results that were available during my care of the patient were reviewed by me and considered in my medical decision making (see chart for details).  Patient brought in by family because of concerns for abnormal behavior and possible worsening of dementia. On exam here patient is not oriented and is demented. The patient had blood work and urine as well as a head CT check. No obvious concerning findings at this time. Patient was given Ativan here in the emergency department. Discussed with wife that we can give prescription for Ativan. Wife apparently has already been talking to primary care physician about possible placement or long-term care options. Did encourage wife to continue to have these  conversations.  ____________________________________________   FINAL CLINICAL IMPRESSION(S) / ED DIAGNOSES  Final diagnoses:  Abnormal behavior     Note: This dictation was prepared with Dragon dictation. Any transcriptional errors that result from this process are unintentional     Phineas SemenGraydon Luisdaniel Kenton, MD 01/06/17 2202

## 2017-01-06 NOTE — ED Triage Notes (Signed)
Wife reports that patient has a history of dementia and that patient started becoming more confused and restless yesterday. Wife reports that patient was unable to sleep last night.

## 2017-05-12 ENCOUNTER — Encounter: Payer: Self-pay | Admitting: Emergency Medicine

## 2017-05-12 ENCOUNTER — Emergency Department: Payer: Medicare Other

## 2017-05-12 ENCOUNTER — Emergency Department
Admission: EM | Admit: 2017-05-12 | Discharge: 2017-05-12 | Disposition: A | Discharge status: Other Acute Inpatient Hospital | Payer: Medicare Other | Attending: Emergency Medicine | Admitting: Emergency Medicine

## 2017-05-12 DIAGNOSIS — W19XXXA Unspecified fall, initial encounter: Secondary | ICD-10-CM

## 2017-05-12 DIAGNOSIS — I10 Essential (primary) hypertension: Secondary | ICD-10-CM | POA: Diagnosis not present

## 2017-05-12 DIAGNOSIS — Z79899 Other long term (current) drug therapy: Secondary | ICD-10-CM | POA: Insufficient documentation

## 2017-05-12 DIAGNOSIS — S065XAA Traumatic subdural hemorrhage with loss of consciousness status unknown, initial encounter: Secondary | ICD-10-CM

## 2017-05-12 DIAGNOSIS — R001 Bradycardia, unspecified: Secondary | ICD-10-CM | POA: Diagnosis not present

## 2017-05-12 DIAGNOSIS — Z87891 Personal history of nicotine dependence: Secondary | ICD-10-CM | POA: Diagnosis not present

## 2017-05-12 DIAGNOSIS — I6200 Nontraumatic subdural hemorrhage, unspecified: Secondary | ICD-10-CM | POA: Insufficient documentation

## 2017-05-12 DIAGNOSIS — W2209XA Striking against other stationary object, initial encounter: Secondary | ICD-10-CM | POA: Insufficient documentation

## 2017-05-12 DIAGNOSIS — Y93E8 Activity, other personal hygiene: Secondary | ICD-10-CM | POA: Insufficient documentation

## 2017-05-12 DIAGNOSIS — R55 Syncope and collapse: Secondary | ICD-10-CM | POA: Insufficient documentation

## 2017-05-12 DIAGNOSIS — Z7902 Long term (current) use of antithrombotics/antiplatelets: Secondary | ICD-10-CM | POA: Diagnosis not present

## 2017-05-12 DIAGNOSIS — Y999 Unspecified external cause status: Secondary | ICD-10-CM | POA: Diagnosis not present

## 2017-05-12 DIAGNOSIS — Z7982 Long term (current) use of aspirin: Secondary | ICD-10-CM | POA: Diagnosis not present

## 2017-05-12 DIAGNOSIS — Y92002 Bathroom of unspecified non-institutional (private) residence single-family (private) house as the place of occurrence of the external cause: Secondary | ICD-10-CM | POA: Insufficient documentation

## 2017-05-12 DIAGNOSIS — I2510 Atherosclerotic heart disease of native coronary artery without angina pectoris: Secondary | ICD-10-CM | POA: Insufficient documentation

## 2017-05-12 DIAGNOSIS — S065X9A Traumatic subdural hemorrhage with loss of consciousness of unspecified duration, initial encounter: Secondary | ICD-10-CM

## 2017-05-12 DIAGNOSIS — E119 Type 2 diabetes mellitus without complications: Secondary | ICD-10-CM | POA: Diagnosis not present

## 2017-05-12 LAB — URINALYSIS, COMPLETE (UACMP) WITH MICROSCOPIC
BACTERIA UA: NONE SEEN
BILIRUBIN URINE: NEGATIVE
Glucose, UA: NEGATIVE mg/dL
KETONES UR: NEGATIVE mg/dL
LEUKOCYTES UA: NEGATIVE
NITRITE: NEGATIVE
Protein, ur: NEGATIVE mg/dL
SPECIFIC GRAVITY, URINE: 1.016 (ref 1.005–1.030)
pH: 6 (ref 5.0–8.0)

## 2017-05-12 LAB — BASIC METABOLIC PANEL
Anion gap: 6 (ref 5–15)
BUN: 19 mg/dL (ref 6–20)
CHLORIDE: 101 mmol/L (ref 101–111)
CO2: 28 mmol/L (ref 22–32)
Calcium: 9.6 mg/dL (ref 8.9–10.3)
Creatinine, Ser: 1.48 mg/dL — ABNORMAL HIGH (ref 0.61–1.24)
GFR calc Af Amer: 50 mL/min — ABNORMAL LOW (ref >60)
GFR, EST NON AFRICAN AMERICAN: 43 mL/min — AB (ref >60)
GLUCOSE: 117 mg/dL — AB (ref 65–99)
POTASSIUM: 4.8 mmol/L (ref 3.5–5.1)
Sodium: 135 mmol/L (ref 135–145)

## 2017-05-12 LAB — CBC
HEMATOCRIT: 46.2 % (ref 40.0–52.0)
HEMOGLOBIN: 15.6 g/dL (ref 13.0–18.0)
MCH: 28.8 pg (ref 26.0–34.0)
MCHC: 33.7 g/dL (ref 32.0–36.0)
MCV: 85.2 fL (ref 80.0–100.0)
Platelets: 194 10*3/uL (ref 150–440)
RBC: 5.43 MIL/uL (ref 4.40–5.90)
RDW: 14 % (ref 11.5–14.5)
WBC: 5.6 10*3/uL (ref 3.8–10.6)

## 2017-05-12 LAB — TROPONIN I

## 2017-05-12 NOTE — ED Notes (Signed)
Family at bedside. 

## 2017-05-12 NOTE — ED Notes (Signed)
Pt cleaned up and changed by EDT Mayra. Pt repositioned and warm blankets given.

## 2017-05-12 NOTE — ED Notes (Signed)
Patient transported to CT 

## 2017-05-12 NOTE — ED Notes (Signed)
Pt back from CT

## 2017-05-12 NOTE — ED Triage Notes (Signed)
[facility] with c/o possible syncopal episode. Per EMS pt was found with his head against the toilet, and urinated on himself. Pt presents with C-collar in place at this time. Per EMS pt is from home. Per EMS no complaints of pain, no obvious injury, no pain with palpation of the spine. EMS reports pt is currently taking plavix at this time, also reports RBBB on 12lead. Pt presents to ED with alert, oriented to self, but disoriented to place, time and situation, pt has baseline of dementia.

## 2017-05-12 NOTE — ED Provider Notes (Signed)
[facility] Emergency Department Provider Note   ____________________________________________   First MD Initiated Contact with Patient 05/12/17 0140     (approximate)  I have reviewed the triage vital signs and the nursing notes.   HISTORY  Chief Complaint Loss of Consciousness  Level 5 caveat: limited by dementia  HPI Anthony Thornton is a 81 y.o. male brought to the ED from home via EMS with a chief complaint of syncope/fall. Per EMS, patient was found in the restroom with his head against the toilet, having urinated on himself. Last thing patient remembers is passing out in the bathroom. Denies symptoms; specifically, denies fever, chills, chest pain, shortness of breath, abdominal pain, nausea, vomiting. Denies recent travel or trauma.   Past Medical History:  Diagnosis Date  . Dementia   . Diabetes mellitus without complication (HCC)   . Hypercholesteremia   . Hypertension     There are no active problems to display for this patient.   Past Surgical History:  Procedure Laterality Date  . AORTIC VALVE REPLACEMENT (AVR)/CORONARY ARTERY BYPASS GRAFTING (CABG)      Prior to Admission medications   Medication Sig Start Date End Date Taking? Authorizing Provider  aspirin EC 81 MG tablet Take 81 mg by mouth daily.    [provider]  atenolol (TENORMIN) 25 MG tablet Take 25 mg by mouth daily. 01/01/17   [provider]  clopidogrel (PLAVIX) 75 MG tablet Take 75 mg by mouth daily. 01/05/17   [provider]  donepezil (ARICEPT) 10 MG tablet Take 10 mg by mouth daily. 01/03/17   [provider]  furosemide (LASIX) 20 MG tablet Take 20 mg by mouth daily. 10/28/16   [provider]  lisinopril (PRINIVIL,ZESTRIL) 5 MG tablet Take 5 mg by mouth daily. 11/29/16   [provider]  LORazepam (ATIVAN) 0.5 MG tablet Take 1 tablet (0.5 mg total) by mouth every 8 (eight) hours as needed for sleep (agitation).  01/06/17 01/06/18  Phineas SemenGoodman, Graydon, MD  pravastatin (PRAVACHOL) 80 MG tablet Take 80 mg by mouth daily. 01/05/17   [provider]    Allergies Patient has no known allergies.  History reviewed. No pertinent family history.  Social History Social History  Substance Use Topics  . Smoking status: Former Games developermoker  . Smokeless tobacco: Never Used  . Alcohol use No    Review of Systems  Constitutional: No fever/chills. Eyes: No visual changes. ENT: No sore throat. Cardiovascular: Denies chest pain. Respiratory: Denies shortness of breath. Gastrointestinal: No abdominal pain.  No nausea, no vomiting.  No diarrhea.  No constipation. Genitourinary: Negative for dysuria. Musculoskeletal: Negative for back pain. Skin: Negative for rash. Neurological: Positive for syncope. Negative for headaches, focal weakness or numbness.   ____________________________________________   PHYSICAL EXAM:  VITAL SIGNS: ED Triage Vitals [05/12/17 0136]  Enc Vitals Group     BP (!) 155/70     Pulse Rate (!) 52     Resp 20     Temp 98 F (36.7 C)     Temp Source Oral     SpO2 96 %     Weight 215 lb (97.5 kg)     Height 6' (1.829 m)     Head Circumference      Peak Flow      Pain Score      Pain Loc      Pain Edu?      Excl. in GC?     Constitutional: Alert and  oriented. Well appearing and in no acute distress. Eyes: Conjunctivae are normal. PERRL. EOMI. Head: Atraumatic. Nose: No congestion/rhinnorhea. Mouth/Throat: Mucous membranes are moist.  Oropharynx non-erythematous. Neck: No stridor.  No cervical spine tenderness to palpation.  C-collar in place. Cardiovascular: Bradycardic rate, regular rhythm. Grossly normal heart sounds.  Good peripheral circulation. Respiratory: Normal respiratory effort.  No retractions. Lungs CTAB. Gastrointestinal: Soft and nontender. No distention. No abdominal bruits. No CVA tenderness. Musculoskeletal: No lower extremity tenderness nor edema.  No  joint effusions. Neurologic:  Alert and oriented to person. Normal speech and language. No gross focal neurologic deficits are appreciated.  Skin:  Skin is warm, dry and intact. No rash noted. Psychiatric: Mood and affect are normal. Speech and behavior are normal.  ____________________________________________   LABS (all labs ordered are listed, but only abnormal results are displayed)  Labs Reviewed  BASIC METABOLIC PANEL - Abnormal; Notable for the following:       Result Value   Glucose, Bld 117 (*)    Creatinine, Ser 1.48 (*)    GFR calc non Af Amer 43 (*)    GFR calc Af Amer 50 (*)    All other components within normal limits  URINALYSIS, COMPLETE (UACMP) WITH MICROSCOPIC - Abnormal; Notable for the following:    Color, Urine YELLOW (*)    APPearance CLEAR (*)    Hgb urine dipstick SMALL (*)    Squamous Epithelial / LPF 0-5 (*)    All other components within normal limits  CBC  TROPONIN I   ____________________________________________  EKG  ED ECG REPORT I, Jaymes Revels J, the attending physician, personally viewed and interpreted this ECG.   Date: 05/12/2017  EKG Time: 0135  Rate: 52  Rhythm: sinus bradycardia  Axis: LAD  Intervals:right bundle branch block LAFB  ST&T Change: Nonspecific  ____________________________________________  RADIOLOGY  Dg Chest 1 View  Result Date: 05/12/2017 CLINICAL DATA:  Syncope EXAM: CHEST 1 VIEW COMPARISON:  None. FINDINGS: The heart size and mediastinal contours are within normal limits, allowing for post CABG appearance. Both lungs are clear. The visualized skeletal structures are unremarkable. IMPRESSION: No active disease. Electronically Signed   By: Deatra Robinson M.D.   On: 05/12/2017 02:03   Ct Head Wo Contrast  Result Date: 05/12/2017 CLINICAL DATA:  Acute onset of syncope and incontinence. Concern for head or cervical spine injury. Initial encounter. EXAM: CT HEAD WITHOUT CONTRAST CT CERVICAL SPINE WITHOUT CONTRAST  TECHNIQUE: Multidetector CT imaging of the head and cervical spine was performed following the standard protocol without intravenous contrast. Multiplanar CT image reconstructions of the cervical spine were also generated. COMPARISON:  CT of the head performed 01/06/2017, and CT of the cervical spine performed 10/13/2014. MRI of the brain performed 04/22/2014 FINDINGS: CT HEAD FINDINGS Brain: There is a new large acute on chronic left subdural hematoma overlying the left cerebral hemisphere, measuring up to 1.9 cm in thickness, with associated mass effect and approximately 7 mm of rightward midline shift. A chronic right-sided subdural hematoma demonstrates perhaps minimal acute components superiorly and inferiorly, measuring up to 1.3 cm in thickness. Prominence of the ventricles and sulci reflects moderate cortical volume loss. Mild cerebellar atrophy is noted. Scattered periventricular and subcortical white matter change likely reflects small vessel ischemic microangiopathy. A chronic infarct is noted at the right parietal-occipital region, with associated encephalomalacia. The brainstem and fourth ventricle are within normal limits. Vascular: No hyperdense vessel or unexpected calcification. Skull: There is no evidence of fracture; visualized osseous structures are unremarkable  in appearance. Sinuses/Orbits: The orbits are within normal limits. The paranasal sinuses and mastoid air cells are well-aerated. Other: No significant soft tissue abnormalities are seen. CT CERVICAL SPINE FINDINGS Alignment: Normal. Skull base and vertebrae: No acute fracture. No primary bone lesion or focal pathologic process. Soft tissues and spinal canal: No prevertebral fluid or swelling. No visible canal hematoma. Disc levels: Minimal disc space narrowing is noted at C2-C3 and at C5-C6, with small anterior and posterior disc osteophyte complexes at C5-C6, and underlying facet disease. Degenerative change is noted about the dens.  Upper chest: The visualized lung apices are grossly clear. The thyroid gland is unremarkable. Calcification is noted at the carotid bifurcations bilaterally. Other: No additional soft tissue abnormalities are seen. IMPRESSION: 1. New large acute on chronic left subdural hematoma overlying the left cerebral hemisphere, measuring up to 1.9 cm in thickness, with associated mass effect and approximately 7 mm of rightward midline shift. Prominent acute components noted within the hematoma, likely reflecting recent injury. 2. Chronic right-sided subdural hematoma demonstrates perhaps minimal acute components superiorly and inferiorly, measuring up to 1.3 cm in thickness. 3. No evidence of fracture or subluxation along the cervical spine. 4. Moderate cortical volume loss and scattered small vessel ischemic microangiopathy. 5. 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 6. Mild degenerative change along the cervical spine. 7. Calcification at the carotid bifurcations bilaterally. Carotid ultrasound would be helpful for further evaluation, when and as deemed clinically appropriate. Critical Value/emergent results were called by telephone at the time of interpretation on 05/12/2017 at 2:38 am to Dr. Chiquita Loth , who verbally acknowledged these results. Electronically Signed   By: Roanna Raider M.D.   On: 05/12/2017 02:47   Ct Cervical Spine Wo Contrast  Result Date: 05/12/2017 CLINICAL DATA:  Acute onset of syncope and incontinence. Concern for head or cervical spine injury. Initial encounter. EXAM: CT HEAD WITHOUT CONTRAST CT CERVICAL SPINE WITHOUT CONTRAST TECHNIQUE: Multidetector CT imaging of the head and cervical spine was performed following the standard protocol without intravenous contrast. Multiplanar CT image reconstructions of the cervical spine were also generated. COMPARISON:  CT of the head performed 01/06/2017, and CT of the cervical spine performed 10/13/2014. MRI  of the brain performed 04/22/2014 FINDINGS: CT HEAD FINDINGS Brain: There is a new large acute on chronic left subdural hematoma overlying the left cerebral hemisphere, measuring up to 1.9 cm in thickness, with associated mass effect and approximately 7 mm of rightward midline shift. A chronic right-sided subdural hematoma demonstrates perhaps minimal acute components superiorly and inferiorly, measuring up to 1.3 cm in thickness. Prominence of the ventricles and sulci reflects moderate cortical volume loss. Mild cerebellar atrophy is noted. Scattered periventricular and subcortical white matter change likely reflects small vessel ischemic microangiopathy. A chronic infarct is noted at the right parietal-occipital region, with associated encephalomalacia. The brainstem and fourth ventricle are within normal limits. Vascular: No hyperdense vessel or unexpected calcification. Skull: There is no evidence of fracture; visualized osseous structures are unremarkable in appearance. Sinuses/Orbits: The orbits are within normal limits. The paranasal sinuses and mastoid air cells are well-aerated. Other: No significant soft tissue abnormalities are seen. CT CERVICAL SPINE FINDINGS Alignment: Normal. Skull base and vertebrae: No acute fracture. No primary bone lesion or focal pathologic process. Soft tissues and spinal canal: No prevertebral fluid or swelling. No visible canal hematoma. Disc levels: Minimal disc space narrowing is noted at C2-C3 and at C5-C6, with small anterior and posterior disc osteophyte complexes at C5-C6,  and underlying facet disease. Degenerative change is noted about the dens. Upper chest: The visualized lung apices are grossly clear. The thyroid gland is unremarkable. Calcification is noted at the carotid bifurcations bilaterally. Other: No additional soft tissue abnormalities are seen. IMPRESSION: 1. New large acute on chronic left subdural hematoma overlying the left cerebral hemisphere, measuring  up to 1.9 cm in thickness, with associated mass effect and approximately 7 mm of rightward midline shift. Prominent acute components noted within the hematoma, likely reflecting recent injury. 2. Chronic right-sided subdural hematoma demonstrates perhaps minimal acute components superiorly and inferiorly, measuring up to 1.3 cm in thickness. 3. No evidence of fracture or subluxation along the cervical spine. 4. Moderate cortical volume loss and scattered small vessel ischemic microangiopathy. 5. 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 6. Mild degenerative change along the cervical spine. 7. Calcification at the carotid bifurcations bilaterally. Carotid ultrasound would be helpful for further evaluation, when and as deemed clinically appropriate. Critical Value/emergent results were called by telephone at the time of interpretation on 05/12/2017 at 2:38 am to Dr. Chiquita Loth , who verbally acknowledged these results. Electronically Signed   By: Roanna Raider M.D.   On: 05/12/2017 02:47    ____________________________________________   PROCEDURES  Procedure(s) performed: None  Procedures  Critical Care performed:  CRITICAL CARE Performed by: Irean Hong   Total critical care time: 30 minutes  Critical care time was exclusive of separately billable procedures and treating other patients.  Critical care was necessary to treat or prevent imminent or life-threatening deterioration.  Critical care was time spent personally by me on the following activities: development of treatment plan with patient and/or surrogate as well as nursing, discussions with consultants, evaluation of patient's response to treatment, examination of patient, obtaining history from patient or surrogate, ordering and performing treatments and interventions, ordering and review of laboratory studies, ordering and review of radiographic studies, pulse oximetry and re-evaluation of  patient's condition.  ____________________________________________   INITIAL IMPRESSION / ASSESSMENT AND PLAN / ED COURSE  Pertinent labs & imaging results that were available during my care of the patient were reviewed by me and considered in my medical decision making (see chart for details).  81 year old male with diabetes, hypertension, dementia who presents with syncopal episode with fall in bathroom. Will obtain screening lab work including troponin, CT head and cervical spine, and reassess.  Clinical Course as of May 13 515  Thu May 12, 2017  0306 Updated patient and family members of CT imaging results. Unknown if patient suffered syncopal episode resulting in fall with acute subdural hematoma or patient had mechanical fall resulting in acute subdural hematoma. Discussed with family; will need to transfer patient to a tertiary facility. They request [facility]; will call transfer center. Family states patient is in his baseline mental state.  [JS]  [id number] Patient was auto-accepted by [facility]. Family updated.  [JS]    Clinical Course User Index [JS] Irean Hong, MD     ____________________________________________   FINAL CLINICAL IMPRESSION(S) / ED DIAGNOSES  Final diagnoses:  Syncope, unspecified syncope type  Fall, initial encounter  Subdural hematoma (HCC)  Bradycardia      NEW MEDICATIONS STARTED DURING THIS VISIT:  Discharge Medication List as of 05/12/2017  4:47 AM       Note:  This document was prepared using Dragon voice recognition software and may include unintentional dictation errors.    Irean Hong, MD 05/12/17 [phone]

## 2017-05-13 DIAGNOSIS — E119 Type 2 diabetes mellitus without complications: Secondary | ICD-10-CM | POA: Insufficient documentation

## 2017-05-13 DIAGNOSIS — F0390 Unspecified dementia without behavioral disturbance: Secondary | ICD-10-CM | POA: Insufficient documentation

## 2017-07-18 ENCOUNTER — Encounter: Payer: Self-pay | Admitting: Emergency Medicine

## 2017-07-18 ENCOUNTER — Emergency Department
Admission: EM | Admit: 2017-07-18 | Discharge: 2017-07-18 | Disposition: A | Discharge status: Home / Self Care | Payer: Medicare Other | Attending: Emergency Medicine | Admitting: Emergency Medicine

## 2017-07-18 DIAGNOSIS — F0391 Unspecified dementia with behavioral disturbance: Secondary | ICD-10-CM | POA: Diagnosis present

## 2017-07-18 DIAGNOSIS — Z79899 Other long term (current) drug therapy: Secondary | ICD-10-CM | POA: Diagnosis not present

## 2017-07-18 DIAGNOSIS — E119 Type 2 diabetes mellitus without complications: Secondary | ICD-10-CM | POA: Diagnosis not present

## 2017-07-18 DIAGNOSIS — F0390 Unspecified dementia without behavioral disturbance: Secondary | ICD-10-CM | POA: Insufficient documentation

## 2017-07-18 DIAGNOSIS — I10 Essential (primary) hypertension: Secondary | ICD-10-CM | POA: Diagnosis not present

## 2017-07-18 DIAGNOSIS — Z7982 Long term (current) use of aspirin: Secondary | ICD-10-CM | POA: Diagnosis not present

## 2017-07-18 HISTORY — DX: Unspecified glaucoma: H40.9

## 2017-07-18 HISTORY — DX: Other pulmonary embolism without acute cor pulmonale: I26.99

## 2017-07-18 HISTORY — DX: Adjustment disorder with anxiety: F43.22

## 2017-07-18 HISTORY — DX: Presence of cardiac pacemaker: Z95.0

## 2017-07-18 HISTORY — DX: Cerebral infarction, unspecified: I63.9

## 2017-07-18 MED ORDER — LORAZEPAM 1 MG PO TABS
ORAL_TABLET | ORAL | Status: AC
  Administered 2017-07-18: 1 mg via ORAL
  Filled 2017-07-18: qty 1

## 2017-07-18 MED ORDER — LORAZEPAM 1 MG PO TABS
1.0000 mg | ORAL_TABLET | Freq: Once | ORAL | Status: AC
  Administered 2017-07-18: 1 mg via ORAL

## 2017-07-18 NOTE — ED Notes (Signed)
Pt lying on bed. Pt is not changed into paper scrubs. Pt has on his personal belongings. Pt given warm blanket at this time. Pt is sleeping.

## 2017-07-18 NOTE — ED Notes (Signed)
Pt given breakfast tray

## 2017-07-18 NOTE — ED Triage Notes (Signed)
Patient brought in by ems from [facility]. Per EMS patient was combative toward staff. Patient has a history of dementia and is calm and cooperative at this time.

## 2017-07-18 NOTE — ED Notes (Signed)
Laverne contacted at [facility] to assess patients needs.

## 2017-07-18 NOTE — ED Notes (Signed)
Laverne contacted at [facility] at [phone]. Per report patient has been repeatedly aggressive at [facility]. PRN order for ativan available however when patient is agitated he will not take po meds. Plan med po now to prepare for transport home and then MD will provide ativan gel order for use at [facility] when patient will not take PO. Patient will be evaluated over next few days for effectiveness of this med and assessment for need for additional medication. Patient takes po meds now and states desire to go home. Breakfast ordered for patient to take while waits for transport which will be after 10am.

## 2017-07-18 NOTE — ED Notes (Signed)
Pt ambulated to bathroom with no assistance.  
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
## 2017-07-18 NOTE — ED Notes (Signed)
Have attempted x 3 to contact Anthony Thornton without success regarding ERMD inability to write ativan gel prescription. I have left her a voicemail and written a note which I am giving to transport driver regarding that so that they can get RX from patients regular MD as [name] states they will be seeing patient today. Transport her to take patient back to [facility].

## 2017-07-18 NOTE — ED Notes (Signed)
[facility] contacted to make sure transport has been called. They will recheck with transport.

## 2017-07-18 NOTE — ED Notes (Signed)
Sitting in wheelchair waiting transport. Breakfast taken without difficulty. Ambulatory to BR without assist while waiting.

## 2017-08-22 ENCOUNTER — Emergency Department
Admission: EM | Admit: 2017-08-22 | Discharge: 2017-08-23 | Disposition: A | Discharge status: Other Acute Inpatient Hospital | Payer: Medicare Other | Attending: Emergency Medicine | Admitting: Emergency Medicine

## 2017-08-22 ENCOUNTER — Encounter: Payer: Self-pay | Admitting: Emergency Medicine

## 2017-08-22 DIAGNOSIS — R4689 Other symptoms and signs involving appearance and behavior: Secondary | ICD-10-CM | POA: Diagnosis present

## 2017-08-22 DIAGNOSIS — Z87891 Personal history of nicotine dependence: Secondary | ICD-10-CM | POA: Insufficient documentation

## 2017-08-22 DIAGNOSIS — F0150 Vascular dementia without behavioral disturbance: Secondary | ICD-10-CM

## 2017-08-22 DIAGNOSIS — Z7902 Long term (current) use of antithrombotics/antiplatelets: Secondary | ICD-10-CM | POA: Diagnosis not present

## 2017-08-22 DIAGNOSIS — F0390 Unspecified dementia without behavioral disturbance: Secondary | ICD-10-CM | POA: Insufficient documentation

## 2017-08-22 DIAGNOSIS — E119 Type 2 diabetes mellitus without complications: Secondary | ICD-10-CM | POA: Diagnosis not present

## 2017-08-22 DIAGNOSIS — S0990XA Unspecified injury of head, initial encounter: Secondary | ICD-10-CM | POA: Diagnosis not present

## 2017-08-22 DIAGNOSIS — Z7982 Long term (current) use of aspirin: Secondary | ICD-10-CM | POA: Insufficient documentation

## 2017-08-22 DIAGNOSIS — I10 Essential (primary) hypertension: Secondary | ICD-10-CM | POA: Insufficient documentation

## 2017-08-22 DIAGNOSIS — F02818 Dementia in other diseases classified elsewhere, unspecified severity, with other behavioral disturbance: Secondary | ICD-10-CM

## 2017-08-22 DIAGNOSIS — Z952 Presence of prosthetic heart valve: Secondary | ICD-10-CM | POA: Insufficient documentation

## 2017-08-22 DIAGNOSIS — Z79899 Other long term (current) drug therapy: Secondary | ICD-10-CM | POA: Insufficient documentation

## 2017-08-22 DIAGNOSIS — Z950 Presence of cardiac pacemaker: Secondary | ICD-10-CM | POA: Diagnosis not present

## 2017-08-22 DIAGNOSIS — S0990XS Unspecified injury of head, sequela: Secondary | ICD-10-CM

## 2017-08-22 DIAGNOSIS — F0281 Dementia in other diseases classified elsewhere with behavioral disturbance: Secondary | ICD-10-CM | POA: Diagnosis not present

## 2017-08-22 LAB — URINALYSIS, COMPLETE (UACMP) WITH MICROSCOPIC
Bacteria, UA: NONE SEEN
Bilirubin Urine: NEGATIVE
Glucose, UA: NEGATIVE mg/dL
HGB URINE DIPSTICK: NEGATIVE
Ketones, ur: NEGATIVE mg/dL
Leukocytes, UA: NEGATIVE
NITRITE: NEGATIVE
PH: 7 (ref 5.0–8.0)
Protein, ur: NEGATIVE mg/dL
SPECIFIC GRAVITY, URINE: 1.009 (ref 1.005–1.030)
Squamous Epithelial / LPF: NONE SEEN
WBC UA: NONE SEEN WBC/hpf (ref 0–5)

## 2017-08-22 LAB — URINE DRUG SCREEN, QUALITATIVE (ARMC ONLY)
Amphetamines, Ur Screen: NOT DETECTED
BARBITURATES, UR SCREEN: NOT DETECTED
BENZODIAZEPINE, UR SCRN: NOT DETECTED
CANNABINOID 50 NG, UR ~~LOC~~: NOT DETECTED
Cocaine Metabolite,Ur ~~LOC~~: NOT DETECTED
MDMA (Ecstasy)Ur Screen: NOT DETECTED
Methadone Scn, Ur: NOT DETECTED
Opiate, Ur Screen: NOT DETECTED
PHENCYCLIDINE (PCP) UR S: NOT DETECTED
TRICYCLIC, UR SCREEN: NOT DETECTED

## 2017-08-22 LAB — COMPREHENSIVE METABOLIC PANEL
ALT: 18 U/L (ref 17–63)
AST: 24 U/L (ref 15–41)
Albumin: 3.3 g/dL — ABNORMAL LOW (ref 3.5–5.0)
Alkaline Phosphatase: 42 U/L (ref 38–126)
Anion gap: 7 (ref 5–15)
BILIRUBIN TOTAL: 0.6 mg/dL (ref 0.3–1.2)
BUN: 14 mg/dL (ref 6–20)
CHLORIDE: 104 mmol/L (ref 101–111)
CO2: 26 mmol/L (ref 22–32)
CREATININE: 0.93 mg/dL (ref 0.61–1.24)
Calcium: 9.1 mg/dL (ref 8.9–10.3)
Glucose, Bld: 123 mg/dL — ABNORMAL HIGH (ref 65–99)
Potassium: 4.4 mmol/L (ref 3.5–5.1)
Sodium: 137 mmol/L (ref 135–145)
TOTAL PROTEIN: 6.3 g/dL — AB (ref 6.5–8.1)

## 2017-08-22 LAB — CBC
HCT: 39 % — ABNORMAL LOW (ref 40.0–52.0)
Hemoglobin: 12.9 g/dL — ABNORMAL LOW (ref 13.0–18.0)
MCH: 29.6 pg (ref 26.0–34.0)
MCHC: 33.2 g/dL (ref 32.0–36.0)
MCV: 89.2 fL (ref 80.0–100.0)
PLATELETS: 198 10*3/uL (ref 150–440)
RBC: 4.37 MIL/uL — ABNORMAL LOW (ref 4.40–5.90)
RDW: 14 % (ref 11.5–14.5)
WBC: 6.2 10*3/uL (ref 3.8–10.6)

## 2017-08-22 LAB — ETHANOL

## 2017-08-22 LAB — ACETAMINOPHEN LEVEL: Acetaminophen (Tylenol), Serum: <10 ug/mL — ABNORMAL LOW (ref 10–30)

## 2017-08-22 LAB — SALICYLATE LEVEL
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
## 2017-08-23 ENCOUNTER — Emergency Department: Payer: Medicare Other

## 2017-08-23 DIAGNOSIS — S0990XA Unspecified injury of head, initial encounter: Secondary | ICD-10-CM | POA: Diagnosis not present

## 2017-08-23 DIAGNOSIS — F02818 Dementia in other diseases classified elsewhere, unspecified severity, with other behavioral disturbance: Secondary | ICD-10-CM

## 2017-08-23 DIAGNOSIS — F0281 Dementia in other diseases classified elsewhere with behavioral disturbance: Secondary | ICD-10-CM

## 2017-08-23 DIAGNOSIS — S0990XS Unspecified injury of head, sequela: Secondary | ICD-10-CM

## 2017-08-23 DIAGNOSIS — R4689 Other symptoms and signs involving appearance and behavior: Secondary | ICD-10-CM | POA: Diagnosis not present

## 2017-08-23 DIAGNOSIS — F0150 Vascular dementia without behavioral disturbance: Secondary | ICD-10-CM

## 2017-08-23 LAB — VALPROIC ACID LEVEL: VALPROIC ACID LVL: 26 ug/mL — AB (ref 50.0–100.0)

## 2017-08-23 MED ORDER — DIVALPROEX SODIUM 500 MG PO DR TAB
500.0000 mg | DELAYED_RELEASE_TABLET | Freq: Two times a day (BID) | ORAL | Status: DC
  Administered 2017-08-23: 500 mg via ORAL
  Filled 2017-08-23: qty 1

## 2017-08-23 MED ORDER — DONEPEZIL HCL 5 MG PO TABS: 10.0000 mg | ORAL_TABLET | Freq: Every day | ORAL | Status: DC

## 2017-08-23 MED ORDER — BACLOFEN 10 MG PO TABS
10.0000 mg | ORAL_TABLET | Freq: Three times a day (TID) | ORAL | Status: DC
  Administered 2017-08-23: 10 mg via ORAL
  Filled 2017-08-23 (×3): qty 1

## 2017-08-23 MED ORDER — LISINOPRIL 5 MG PO TABS
5.0000 mg | ORAL_TABLET | Freq: Every day | ORAL | Status: DC
  Administered 2017-08-23: 5 mg via ORAL
  Filled 2017-08-23: qty 1

## 2017-08-23 MED ORDER — TRAZODONE HCL 50 MG PO TABS: 50.0000 mg | ORAL_TABLET | Freq: Every day | ORAL | Status: DC

## 2017-08-23 MED ORDER — ASPIRIN EC 81 MG PO TBEC
81.0000 mg | DELAYED_RELEASE_TABLET | Freq: Every day | ORAL | Status: DC
  Administered 2017-08-23: 81 mg via ORAL
  Filled 2017-08-23: qty 1

## 2017-08-23 MED ORDER — ATENOLOL 25 MG PO TABS
25.0000 mg | ORAL_TABLET | Freq: Every day | ORAL | Status: DC
  Administered 2017-08-23: 25 mg via ORAL
  Filled 2017-08-23: qty 1

## 2017-08-23 MED ORDER — FUROSEMIDE 40 MG PO TABS
20.0000 mg | ORAL_TABLET | Freq: Every day | ORAL | Status: DC
  Administered 2017-08-23: 20 mg via ORAL
  Filled 2017-08-23: qty 1

## 2017-08-23 MED ORDER — LORAZEPAM 0.5 MG PO TABS
0.5000 mg | ORAL_TABLET | Freq: Three times a day (TID) | ORAL | Status: DC | PRN
  Administered 2017-08-23: 0.5 mg via ORAL
  Filled 2017-08-23: qty 1

## 2017-08-23 MED ORDER — OLANZAPINE 5 MG PO TABS: 5.0000 mg | ORAL_TABLET | Freq: Every day | ORAL | 0 refills | Status: DC

## 2017-08-23 NOTE — ED Notes (Signed)
Pt resting quietly on stretcher with eyes closed, rise and fall of chest noted
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
## 2017-08-23 NOTE — Discharge Instructions (Signed)
Return to the ER for any new or worsening symptoms are concerning. Take the medication as prescribed and follow-up with your regular psychiatrist.

## 2017-08-23 NOTE — ED Notes (Addendum)
Attempting to make contact with [facility] concerning his pending discharge from our facility  - EDP has discharge paperwork ready  Psychiatry has seen him two times today  Script for zyprexa at hs written  - I have been placed on hold x2  I will continue to wait    Spoke with Tonna Corner then [place]  [phone]

## 2017-08-23 NOTE — ED Notes (Signed)
Pt ambulatory to toilet without difficulty. 

## 2017-08-23 NOTE — ED Notes (Signed)
He has gone to CT scan and has now returned   NAD assessed  Continue to monitor

## 2017-08-23 NOTE — ED Notes (Signed)
Pt is having visual hallucinations, Pt states that "he can see all his brothers and sisters, and that they are coming towards him."

## 2017-08-23 NOTE — ED Notes (Signed)
Spoke with EDP about getting home meds ordered - updated by pharmacy last evening  - orders to follow

## 2017-08-23 NOTE — ED Notes (Signed)
Pt observed lying in hallway bed -  His eyes are closed     Pt visualized with NAD  No verbalized needs or concerns at this time  Continue to monitor

## 2017-08-23 NOTE — ED Notes (Signed)
BEHAVIORAL HEALTH ROUNDING Patient sleeping: No. Patient alert and oriented: yes Behavior appropriate: Yes.  ; If no, describe:  Nutrition and fluids offered: yes Toileting and hygiene offered: Yes  Sitter present: q15 minute observations and security  monitoring Law enforcement present: Yes  ODS  

## 2017-08-23 NOTE — ED Notes (Signed)
Am meds administered as ordered  Assessment completed  He denies pain   Alert - oriented to self - I told him where he was and what day / year it is today  He states  "i don't believe you  - Why can't I walk around in here."  I attempted to explain and I will continue to reorient as needed     He has been lying or sitting in the hallway bed 20H since my arrival today  - he is assigned to room 23  Encouragement to return to his room so that he may walk around in the room but he states  "No I am happy right here"
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
## 2017-08-23 NOTE — ED Notes (Signed)
After holding for awhile - Report given to Malcolm at [facility]     Pt to transfer when EMS has a truck available

## 2017-08-23 NOTE — ED Notes (Signed)
Pt ambulatory to toilet without assistance; fluids given

## 2017-08-23 NOTE — ED Notes (Signed)

## 2017-08-23 NOTE — ED Provider Notes (Signed)
-----------------------------------------   4:49 PM on 08/23/2017 -----------------------------------------  Case discussed with Dr. Toni Amend to has evaluated the patient and recommends discahrge. He has given the patient a prescription for psychiatric medication.     Dionne Bucy, MD 08/23/17 1650

## 2017-08-23 NOTE — ED Notes (Signed)
ED  Is the patient under IVC or is there intent for IVC:  No  Is the patient medically cleared: Yes.   Is there vacancy in the ED BHU: Yes.   Is the population mix appropriate for patient:  Geriatric    Is the patient awaiting placement in inpatient or outpatient setting:  Has the patient had a psychiatric consult: consult pending  Survey of unit performed for contraband, proper placement and condition of furniture, tampering with fixtures in bathroom, shower, and each patient room: Yes.  ; Findings:  APPEARANCE/BEHAVIOR Calm and cooperative NEURO ASSESSMENT Orientation: oriented to self  Denies pain Hallucinations:  Denies  Speech: Normal Gait: normal RESPIRATORY ASSESSMENT Even  Unlabored respirations  CARDIOVASCULAR ASSESSMENT Pulses equal   regular rate  Skin warm and dry   GASTROINTESTINAL ASSESSMENT no GI complaint EXTREMITIES Full ROM  PLAN OF CARE Provide calm/safe environment. Vital signs assessed twice daily. ED BHU Assessment once each 12-hour shift. Collaborate with TTS daily or as condition indicates. Assure the ED provider has rounded once each shift. Provide and encourage hygiene. Provide redirection as needed. Assess for escalating behavior; address immediately and inform ED provider.  Assess family dynamic and appropriateness for visitation as needed: Yes.  ; If necessary, describe findings:  Educate the patient/family about BHU procedures/visitation: Yes.  ; If necessary, describe findings:

## 2017-08-23 NOTE — ED Notes (Signed)
I called Brandy at [facility] to inform her of his transport at this time  - I was unable to speak with Gearldine Bienenstock but I spoke with Corrie Dandy and she stated she will let the staff know

## 2017-08-29 ENCOUNTER — Encounter: Payer: Self-pay | Admitting: Emergency Medicine

## 2017-08-29 ENCOUNTER — Emergency Department
Admission: EM | Admit: 2017-08-29 | Discharge: 2017-08-29 | Disposition: A | Discharge status: Skilled Nursing Facility | Payer: Medicare Other | Attending: Emergency Medicine | Admitting: Emergency Medicine

## 2017-08-29 DIAGNOSIS — F0391 Unspecified dementia with behavioral disturbance: Secondary | ICD-10-CM | POA: Insufficient documentation

## 2017-08-29 DIAGNOSIS — E785 Hyperlipidemia, unspecified: Secondary | ICD-10-CM | POA: Insufficient documentation

## 2017-08-29 DIAGNOSIS — Z87891 Personal history of nicotine dependence: Secondary | ICD-10-CM | POA: Insufficient documentation

## 2017-08-29 DIAGNOSIS — R55 Syncope and collapse: Secondary | ICD-10-CM | POA: Diagnosis not present

## 2017-08-29 DIAGNOSIS — Z950 Presence of cardiac pacemaker: Secondary | ICD-10-CM | POA: Insufficient documentation

## 2017-08-29 DIAGNOSIS — Z79899 Other long term (current) drug therapy: Secondary | ICD-10-CM | POA: Insufficient documentation

## 2017-08-29 DIAGNOSIS — I10 Essential (primary) hypertension: Secondary | ICD-10-CM | POA: Insufficient documentation

## 2017-08-29 DIAGNOSIS — E119 Type 2 diabetes mellitus without complications: Secondary | ICD-10-CM | POA: Insufficient documentation

## 2017-08-29 DIAGNOSIS — Z8673 Personal history of transient ischemic attack (TIA), and cerebral infarction without residual deficits: Secondary | ICD-10-CM | POA: Insufficient documentation

## 2017-08-29 LAB — COMPREHENSIVE METABOLIC PANEL
ALT: 11 U/L — ABNORMAL LOW (ref 17–63)
ANION GAP: 5 (ref 5–15)
AST: 26 U/L (ref 15–41)
Albumin: 2.5 g/dL — ABNORMAL LOW (ref 3.5–5.0)
Alkaline Phosphatase: 27 U/L — ABNORMAL LOW (ref 38–126)
BILIRUBIN TOTAL: 1.1 mg/dL (ref 0.3–1.2)
BUN: 11 mg/dL (ref 6–20)
CO2: 17 mmol/L — ABNORMAL LOW (ref 22–32)
Calcium: 6.7 mg/dL — ABNORMAL LOW (ref 8.9–10.3)
Chloride: 114 mmol/L — ABNORMAL HIGH (ref 101–111)
Creatinine, Ser: 0.92 mg/dL (ref 0.61–1.24)
GFR calc Af Amer: >60 mL/min (ref >60)
Glucose, Bld: 91 mg/dL (ref 65–99)
POTASSIUM: 3.8 mmol/L (ref 3.5–5.1)
Sodium: 136 mmol/L (ref 135–145)
TOTAL PROTEIN: 4.8 g/dL — AB (ref 6.5–8.1)

## 2017-08-29 LAB — CBC
HEMATOCRIT: 41.9 % (ref 40.0–52.0)
Hemoglobin: 13.7 g/dL (ref 13.0–18.0)
MCH: 29.4 pg (ref 26.0–34.0)
MCHC: 32.6 g/dL (ref 32.0–36.0)
MCV: 90.2 fL (ref 80.0–100.0)
Platelets: 171 10*3/uL (ref 150–440)
RBC: 4.64 MIL/uL (ref 4.40–5.90)
RDW: 14.5 % (ref 11.5–14.5)
WBC: 6.1 10*3/uL (ref 3.8–10.6)

## 2017-08-29 LAB — TROPONIN I

## 2017-08-29 NOTE — Discharge Instructions (Signed)
He has been seen in the emergency department today for a possible syncopal event, passing out.  As we discussed your workup in the emergency department today is overall normal.  However given the possible syncopal event please follow-up with a cardiologist as soon as possible for further evaluation.  Please return immediately to the emergency department for any chest pain, trouble breathing, or for any other syncopal events (passing out).

## 2017-08-29 NOTE — ED Triage Notes (Addendum)
Patient presents to the ED via EMS after a syncopal episode around 8:15am while patient was eating breakfast.  Staff at [facility] house noticed that patient suddenly slumped over in his chair over his breakfast.  Patient is alert but tired at this time.  Patient is denying any pain at this time.  Patient is oriented to self and place but not time.

## 2017-08-29 NOTE — ED Notes (Signed)
Signature pad not working, caregiver states discharge understanding and denies any questions

## 2017-08-29 NOTE — ED Provider Notes (Signed)
[facility] Emergency Department Provider Note  Time seen: 9:05 AM  I have reviewed the triage vital signs and the nursing notes.   HISTORY  Chief Complaint Loss of Consciousness    HPI Anthony Thornton is a 81 y.o. male with a past medical history of CVA, dementia, diabetes, hypertension, hyperlipidemia, pacemaker, presents to the emergency department after a possible syncopal event.  According to EMS report the patient lives in a nursing facility, while eating breakfast he appeared to slump over in his chair for several seconds.  Upon arrival the patient is somnolent, but awakens easily to voice will answer questions and follow commands.  Denies any complaints.   Past Medical History:  Diagnosis Date  . Adjustment disorder with anxiety   . CVA (cerebral vascular accident) (HCC)   . Dementia   . Diabetes mellitus without complication (HCC)   . Glaucoma   . Hypercholesteremia   . Hypertension   . Pacemaker   . PE (pulmonary thromboembolism) [facility](HCC)     Patient Active Problem List   Diagnosis Date Noted  . 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 Vascular dementia 08/23/2017    Past Surgical History:  Procedure Laterality Date  . AORTIC VALVE REPLACEMENT (AVR)/CORONARY ARTERY BYPASS GRAFTING (CABG)      Prior to Admission medications   Medication Sig Start Date End Date Taking? Authorizing Provider  aspirin 81 MG chewable tablet Chew 81 mg by mouth daily.   Yes [provider]  atenolol (TENORMIN) 25 MG tablet Take 25 mg by mouth daily. 01/01/17  Yes [provider]  divalproex (DEPAKOTE) 250 MG DR tablet Take 500 mg by mouth 2 (two) times daily.   Yes [provider]  donepezil (ARICEPT) 10 MG tablet Take 10 mg by mouth at bedtime.  01/03/17  Yes [provider]  furosemide (LASIX) 20 MG tablet Take 20 mg by mouth daily. 10/28/16  Yes [provider]  lisinopril  (PRINIVIL,ZESTRIL) 5 MG tablet Take 5 mg by mouth daily. 11/29/16  Yes [provider]  OLANZapine (ZYPREXA) 2.5 MG tablet Take 2.5 mg by mouth daily.   Yes [provider]  pravastatin (PRAVACHOL) 80 MG tablet Take 80 mg by mouth daily. 01/05/17  Yes [provider]  traZODone (DESYREL) 50 MG tablet Take 50 mg by mouth at bedtime.   Yes [provider]  UNABLE TO FIND Apply 1 mg topically every 8 (eight) hours as needed. LORAZEPAM 1MG /ML TOPICAL GEL   Yes [provider]    No Known Allergies  No family history on file.  Social History Social History  Substance Use Topics  . Smoking status: Former Games developermoker  . Smokeless tobacco: Never Used  . Alcohol use No    Review of Systems Constitutional: Negative for fever. Cardiovascular: Negative for chest pain. Respiratory: Negative for shortness of breath. Gastrointestinal: Negative for abdominal pain Neurological: Negative for headache. All other ROS negative, however possibly limited due to baseline dementia. ____________________________________________   PHYSICAL EXAM:  VITAL SIGNS: ED Triage Vitals  Enc Vitals Group     BP 08/29/17 0846 (!) 146/69     Pulse Rate 08/29/17 0846 (!) 44     Resp 08/29/17 0846 16     Temp 08/29/17 0846 (!) 97.4 F (36.3 C)     Temp Source 08/29/17 0846 Oral     SpO2 08/29/17 0846 100 %     Weight 08/29/17 0847 200 lb (90.7 kg)  Height 08/29/17 0847 5\' 10"  (1.778 m)     Head Circumference --      Peak Flow --      Pain Score --      Pain Loc --      Pain Edu? --      Excl. in GC? --     Constitutional: Somnolent, but awakens easily to voice, will answer questions and follow commands. Eyes: Normal exam ENT   Head: Normocephalic and atraumatic   Mouth/Throat: Mucous membranes are moist. Cardiovascular: Normal rate, regular rhythm. No murmur Respiratory: Normal respiratory effort without tachypnea nor retractions. Breath sounds are clear   Gastrointestinal: Soft and nontender. No distention.  Musculoskeletal: Nontender with normal range of motion in all extremities.  Neurologic:  Normal speech and language. No gross focal neurologic deficits Skin:  Skin is warm, dry and intact.  Psychiatric: Mood and affect are normal.   ____________________________________________    EKG  Reviewed and interpreted by myself shows sinus bradycardia at 45 bpm with a widened QRS, normal axis, normal intervals lateral and inferior ST changes which are unchanged from prior 05/12/17 no ST elevation.  ____________________________________________   INITIAL IMPRESSION / ASSESSMENT AND PLAN / ED COURSE  Pertinent labs & imaging results that were available during my care of the patient were reviewed by me and considered in my medical decision making (see chart for details).  Patient presents to the emergency department after a possible syncopal event.  Differential includes syncope, seizure, ACS, fatigue, generalized weakness.  We will check labs and closely monitor in the emergency department.  Of note the patient is noted to be quite bradycardic around 45 bpm, blood pressure is normal/hypertensive.  In reviewing the patient's records bradycardia appears to be the patient's baseline, last EKG is noted at 52 bpm.  The patient has a pacemaker, will attempt pacemaker interrogation.  The patient's workup has been largely nonrevealing.  Labs are baseline, troponin negative.  Patient does not have a pacemaker contrary to past report.  I reviewed x-rays and confirmed that he does not have a pacemaker.  His heart rate is currently 49-52 bpm which appears to be baseline for the patient.  Blood pressure has remained normal to hypertensive throughout his stay.  Wife states the patient is acting normal, patient denies any complaints.  I discussed the workup and results with the wife and patient.  They are agreeable to discharge back to the facility and will return to  the ER for any other concerning issues.  ____________________________________________   FINAL CLINICAL IMPRESSION(S) / ED DIAGNOSES  Syncope     Minna Antis, MD 08/29/17 1043

## 2017-08-29 NOTE — ED Notes (Signed)
MD in room to discuss plan of care with patient and family.

## 2017-08-29 NOTE — ED Notes (Signed)
Patient ambulatory with assistance to bedside commode.  Patient somewhat unsteady on his feet.  Patient was able to urinate.

## 2017-10-10 ENCOUNTER — Emergency Department: Payer: Medicare Other

## 2017-10-10 ENCOUNTER — Other Ambulatory Visit: Payer: Self-pay

## 2017-10-10 ENCOUNTER — Emergency Department
Admission: EM | Admit: 2017-10-10 | Discharge: 2017-10-10 | Disposition: A | Discharge status: Skilled Nursing Facility | Payer: Medicare Other | Attending: Emergency Medicine | Admitting: Emergency Medicine

## 2017-10-10 DIAGNOSIS — Z8673 Personal history of transient ischemic attack (TIA), and cerebral infarction without residual deficits: Secondary | ICD-10-CM | POA: Insufficient documentation

## 2017-10-10 DIAGNOSIS — R55 Syncope and collapse: Secondary | ICD-10-CM | POA: Diagnosis present

## 2017-10-10 DIAGNOSIS — I10 Essential (primary) hypertension: Secondary | ICD-10-CM | POA: Diagnosis not present

## 2017-10-10 DIAGNOSIS — E119 Type 2 diabetes mellitus without complications: Secondary | ICD-10-CM | POA: Insufficient documentation

## 2017-10-10 DIAGNOSIS — F0390 Unspecified dementia without behavioral disturbance: Secondary | ICD-10-CM

## 2017-10-10 LAB — COMPREHENSIVE METABOLIC PANEL
ALT: 76 U/L — AB (ref 17–63)
AST: 69 U/L — AB (ref 15–41)
Albumin: 3.8 g/dL (ref 3.5–5.0)
Alkaline Phosphatase: 58 U/L (ref 38–126)
Anion gap: 9 (ref 5–15)
BUN: 11 mg/dL (ref 6–20)
CHLORIDE: 101 mmol/L (ref 101–111)
CO2: 24 mmol/L (ref 22–32)
CREATININE: 1.18 mg/dL (ref 0.61–1.24)
Calcium: 9.2 mg/dL (ref 8.9–10.3)
GFR calc non Af Amer: 57 mL/min — ABNORMAL LOW (ref >60)
Glucose, Bld: 130 mg/dL — ABNORMAL HIGH (ref 65–99)
POTASSIUM: 4.7 mmol/L (ref 3.5–5.1)
SODIUM: 134 mmol/L — AB (ref 135–145)
Total Bilirubin: 1.1 mg/dL (ref 0.3–1.2)
Total Protein: 7.4 g/dL (ref 6.5–8.1)

## 2017-10-10 LAB — CBC WITH DIFFERENTIAL/PLATELET
BASOS ABS: 0 10*3/uL (ref 0–0.1)
Basophils Relative: 0 %
EOS ABS: 0.1 10*3/uL (ref 0–0.7)
EOS PCT: 1 %
HCT: 45.6 % (ref 40.0–52.0)
Hemoglobin: 15.3 g/dL (ref 13.0–18.0)
Lymphocytes Relative: 23 %
Lymphs Abs: 1.9 10*3/uL (ref 1.0–3.6)
MCH: 29.6 pg (ref 26.0–34.0)
MCHC: 33.6 g/dL (ref 32.0–36.0)
MCV: 88 fL (ref 80.0–100.0)
Monocytes Absolute: 1.2 10*3/uL — ABNORMAL HIGH (ref 0.2–1.0)
Monocytes Relative: 15 %
Neutro Abs: 5.2 10*3/uL (ref 1.4–6.5)
Neutrophils Relative %: 61 %
PLATELETS: 171 10*3/uL (ref 150–440)
RBC: 5.18 MIL/uL (ref 4.40–5.90)
RDW: 14.4 % (ref 11.5–14.5)
WBC: 8.6 10*3/uL (ref 3.8–10.6)

## 2017-10-10 LAB — URINALYSIS, COMPLETE (UACMP) WITH MICROSCOPIC
BILIRUBIN URINE: NEGATIVE
Bacteria, UA: NONE SEEN
GLUCOSE, UA: NEGATIVE mg/dL
HGB URINE DIPSTICK: NEGATIVE
KETONES UR: 5 mg/dL — AB
LEUKOCYTES UA: NEGATIVE
Nitrite: NEGATIVE
PH: 7 (ref 5.0–8.0)
Protein, ur: 30 mg/dL — AB
SPECIFIC GRAVITY, URINE: 1.019 (ref 1.005–1.030)

## 2017-10-10 LAB — TROPONIN I: Troponin I: <0.03 ng/mL (ref <0.03)

## 2017-10-10 LAB — VALPROIC ACID LEVEL: Valproic Acid Lvl: 28 ug/mL — ABNORMAL LOW (ref 50.0–100.0)

## 2017-10-10 NOTE — ED Notes (Signed)
Per  Lavern at [facility] pt is ambulatory at baseline

## 2017-10-10 NOTE — ED Notes (Signed)
Per Rosanne SackKasey at [facility] , facility will provide transport but it will be aprox 12:45 - 1pm before arrival

## 2017-10-10 NOTE — ED Notes (Signed)
Patient transported to CT 

## 2017-10-10 NOTE — ED Notes (Signed)
Pt ambulated well with steady gait, denies any dizziness

## 2017-10-10 NOTE — ED Provider Notes (Signed)
[facility] Emergency Department Provider Note       Time seen: ----------------------------------------- 7:50 AM on 10/10/2017 ----------------------------------------- Level V caveat: History/ROS limited by altered mental status  I have reviewed the triage vital signs and the nursing notes.  HISTORY   Chief Complaint No chief complaint on file.    HPI Anthony Thornton is a 81 y.o. male with a history of dementia, hypertension, CVA and diabetes who presents to the ED for possible syncopal event. Initially patient was brought in from the nursing home for unknown reasons. Reportedly he did not sleep well last night and was sitting on the edge of his bed and thought to be related to his dementia. When they stood him up he subsequently syncopized and may have had a bowel movement. He cannot give any review of systems or report.  Past Medical History:  Diagnosis Date  . Adjustment disorder with anxiety   . CVA (cerebral vascular accident) (HCC)   . Dementia   . Diabetes mellitus without complication (HCC)   . Glaucoma   . Hypercholesteremia   . Hypertension   . Pacemaker   . PE (pulmonary thromboembolism) [facility](HCC)     Patient Active Problem List   Diagnosis Date Noted  . 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 Vascular dementia 08/23/2017    Past Surgical History:  Procedure Laterality Date  . AORTIC VALVE REPLACEMENT (AVR)/CORONARY ARTERY BYPASS GRAFTING (CABG)      Allergies Patient has no known allergies.  Social History Social History   Tobacco Use  . Smoking status: Former Games developermoker  . Smokeless tobacco: Never Used  Substance Use Topics  . Alcohol use: No  . Drug use: No    Review of Systems Unknown, patient has advanced dementia  All systems negative/normal/unremarkable except as stated in the HPI  ____________________________________________   PHYSICAL EXAM:  VITAL SIGNS: ED Triage Vitals  Enc  Vitals Group     BP      Pulse      Resp      Temp      Temp src      SpO2      Weight      Height      Head Circumference      Peak Flow      Pain Score      Pain Loc      Pain Edu?      Excl. in GC?     Constitutional: Alert but disoriented, Well appearing and in no distress. Eyes: Conjunctivae are normal. Normal extraocular movements. ENT   Head: Normocephalic and atraumatic.   Nose: No congestion/rhinnorhea.   Mouth/Throat: Mucous membranes are moist.   Neck: No stridor. Cardiovascular: Normal rate, regular rhythm. No murmurs, rubs, or gallops. Respiratory: Normal respiratory effort without tachypnea nor retractions. Breath sounds are clear and equal bilaterally. No wheezes/rales/rhonchi. Gastrointestinal: Soft and nontender. Normal bowel sounds Musculoskeletal: Nontender with normal range of motion in extremities. No lower extremity tenderness nor edema. Neurologic:  No gross focal neurologic deficits are appreciated.  Skin:  Skin is warm, dry and intact. No rash noted. Psychiatric: Mood and affect are normal. Speech and behavior are normal.  ____________________________________________  EKG: Interpreted by me. Sinus rhythm rate of 60 bpm, normal PR interval, wide QRS, normal QT. PVCs,  ____________________________________________  ED COURSE:  Pertinent labs & imaging results that were available during my care of the patient were reviewed by me and considered in my  medical decision making (see chart for details). Patient presents for possible syncope, we will assess with labs and imaging as indicated.   Procedures ____________________________________________   LABS (pertinent positives/negatives)  Labs Reviewed  CBC WITH DIFFERENTIAL/PLATELET - Abnormal; Notable for the following components:      Result Value   Monocytes Absolute 1.2 (*)    All other components within normal limits  COMPREHENSIVE METABOLIC PANEL - Abnormal; Notable for the  following components:   Sodium 134 (*)    Glucose, Bld 130 (*)    AST 69 (*)    ALT 76 (*)    GFR calc non Af Amer 57 (*)    All other components within normal limits  URINALYSIS, COMPLETE (UACMP) WITH MICROSCOPIC - Abnormal; Notable for the following components:   Color, Urine YELLOW (*)    APPearance CLEAR (*)    Ketones, ur 5 (*)    Protein, ur 30 (*)    Squamous Epithelial / LPF 0-5 (*)    All other components within normal limits  VALPROIC ACID LEVEL - Abnormal; Notable for the following components:   Valproic Acid Lvl 28 (*)    All other components within normal limits  TROPONIN I   CT imaging of the head revealed improving chronic subdural hematoma ____________________________________________  DIFFERENTIAL DIAGNOSIS   Syncope, anemia, dehydration, electrolyte abnormality, medication side effect, CVA, MI   FINAL ASSESSMENT AND PLAN  Syncope   Plan: Patient had presented for syncope. Patient's labs and imaging were overall reassuring without any acute process identified. He is stable to return to the nursing home.   [name], [name], MD   Note: This note was generated in part or whole with voice recognition software. Voice recognition is usually quite accurate but there are transcription errors that can and very often do occur. I apologize for any typographical errors that were not detected and corrected.     [name], [name], MD 10/10/17 1125

## 2017-10-10 NOTE — ED Triage Notes (Signed)
Pt comes into the ED via EMS from [facility] withy c/o altered mental status. Pt has a hx of dementia . Upon ems arrival , pt stood and vagal down to the floor with ems assistance. Pt is incontinent of stool upon arrival, pt is oriented x1 on arrival and is alert.

## 2017-10-15 ENCOUNTER — Emergency Department
Admission: EM | Admit: 2017-10-15 | Discharge: 2017-10-15 | Disposition: A | Discharge status: Skilled Nursing Facility | Payer: Medicare Other | Attending: Emergency Medicine | Admitting: Emergency Medicine

## 2017-10-15 ENCOUNTER — Other Ambulatory Visit: Payer: Self-pay

## 2017-10-15 ENCOUNTER — Emergency Department: Payer: Medicare Other

## 2017-10-15 DIAGNOSIS — R079 Chest pain, unspecified: Secondary | ICD-10-CM | POA: Insufficient documentation

## 2017-10-15 DIAGNOSIS — Z7982 Long term (current) use of aspirin: Secondary | ICD-10-CM | POA: Diagnosis not present

## 2017-10-15 DIAGNOSIS — Z8673 Personal history of transient ischemic attack (TIA), and cerebral infarction without residual deficits: Secondary | ICD-10-CM | POA: Diagnosis not present

## 2017-10-15 DIAGNOSIS — I10 Essential (primary) hypertension: Secondary | ICD-10-CM | POA: Insufficient documentation

## 2017-10-15 DIAGNOSIS — Z79899 Other long term (current) drug therapy: Secondary | ICD-10-CM | POA: Diagnosis not present

## 2017-10-15 DIAGNOSIS — E119 Type 2 diabetes mellitus without complications: Secondary | ICD-10-CM | POA: Insufficient documentation

## 2017-10-15 LAB — BASIC METABOLIC PANEL
ANION GAP: 10 (ref 5–15)
BUN: 21 mg/dL — ABNORMAL HIGH (ref 6–20)
CHLORIDE: 100 mmol/L — AB (ref 101–111)
CO2: 27 mmol/L (ref 22–32)
Calcium: 9 mg/dL (ref 8.9–10.3)
Creatinine, Ser: 1.1 mg/dL (ref 0.61–1.24)
GFR calc non Af Amer: >60 mL/min (ref >60)
Glucose, Bld: 115 mg/dL — ABNORMAL HIGH (ref 65–99)
Potassium: 3.8 mmol/L (ref 3.5–5.1)
Sodium: 137 mmol/L (ref 135–145)

## 2017-10-15 LAB — CBC
HEMATOCRIT: 43.3 % (ref 40.0–52.0)
HEMOGLOBIN: 14.4 g/dL (ref 13.0–18.0)
MCH: 29.6 pg (ref 26.0–34.0)
MCHC: 33.3 g/dL (ref 32.0–36.0)
MCV: 88.7 fL (ref 80.0–100.0)
Platelets: 193 10*3/uL (ref 150–440)
RBC: 4.89 MIL/uL (ref 4.40–5.90)
RDW: 14.6 % — ABNORMAL HIGH (ref 11.5–14.5)
WBC: 6.4 10*3/uL (ref 3.8–10.6)

## 2017-10-15 LAB — TROPONIN I
Troponin I: 0.03 ng/mL (ref <0.03)
Troponin I: 0.04 ng/mL (ref <0.03)

## 2017-10-15 NOTE — ED Notes (Signed)
Patient denies pain and is resting comfortably.  

## 2017-10-15 NOTE — ED Notes (Signed)
Pt was wandering from room. Pt brought back to room by OgemaBeth, NT. Pt in NAD. Nurse explained that he must stay in his room until his ride came and got him from home. Pt nodding but nurse is unsure if he fully comprehended.

## 2017-10-15 NOTE — Discharge Instructions (Signed)
You have been seen in the emergency department today for chest pain. Your workup has shown normal results. As we discussed please follow-up with your primary care physician in the next 1-2 days for recheck. Return to the emergency department for any further chest pain, trouble breathing, or any other symptom personally concerning to yourself. °

## 2017-10-15 NOTE — ED Triage Notes (Signed)
Pt arrives via ems from [facility], p states that he has had sob for the past couple days and is having chest pain today, pt was tearful according to staff, pt has weakness to left side of his body from past stroke, pt has scar to center of chest, pt was given 324mg  asa via ems

## 2017-10-15 NOTE — ED Provider Notes (Signed)
[facility] Emergency Department Provider Note  Time seen: 3:19 PM  I have reviewed the triage vital signs and the nursing notes.   HISTORY  Chief Complaint Chest Pain    HPI Anthony Thornton is a 81 y.o. male with a past medical history of CVA, dementia, diabetes, hypertension, hyperlipidemia, PE, presents to the emergency department for left-sided chest pain.  According to report the patient was complaining of left-sided chest pain at his nursing facility today.  When I speak to the patient he initially tells me he is not sure why he is here, he is calm and cooperative lying in bed.  When I discussed that EMS had mentioned he was having left-sided chest pain.  He states yes he has been having left-sided chest pain all day today but states it is gone now.  Denies any trouble breathing, nausea or diaphoresis.  Denies abdominal pain leg pain or swelling.  Patient has dementia so his answers are questionable on accuracy.  Overall the patient appears well, calm and cooperative lying in bed, no distress watching TV.  Past Medical History:  Diagnosis Date  . Adjustment disorder with anxiety   . CVA (cerebral vascular accident) (HCC)   . Dementia   . Diabetes mellitus without complication (HCC)   . Glaucoma   . Hypercholesteremia   . Hypertension   . Pacemaker   . PE (pulmonary thromboembolism) [facility](HCC)     Patient Active Problem List   Diagnosis Date Noted  . 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 Vascular dementia 08/23/2017    Past Surgical History:  Procedure Laterality Date  . AORTIC VALVE REPLACEMENT (AVR)/CORONARY ARTERY BYPASS GRAFTING (CABG)      Prior to Admission medications   Medication Sig Start Date End Date Taking? Authorizing Provider  aspirin 81 MG chewable tablet Chew 81 mg by mouth daily.    [provider]  atenolol (TENORMIN) 25 MG tablet Take 25 mg by mouth every morning.     [provider]   divalproex (DEPAKOTE) 250 MG DR tablet Take 250 mg by mouth 2 (two) times daily.     [provider]  donepezil (ARICEPT) 10 MG tablet Take 10 mg by mouth at bedtime.  01/03/17   [provider]  furosemide (LASIX) 20 MG tablet Take 20 mg by mouth daily. 10/28/16   [provider]  lisinopril (PRINIVIL,ZESTRIL) 5 MG tablet Take 5 mg by mouth daily. 11/29/16   [provider]  pravastatin (PRAVACHOL) 80 MG tablet Take 80 mg by mouth daily. 01/05/17   [provider]  traZODone (DESYREL) 50 MG tablet Take 50 mg by mouth at bedtime.    [provider]  UNABLE TO FIND Apply 1 mg topically every 8 (eight) hours as needed. LORAZEPAM 1MG /ML TOPICAL GEL  For agitation    [provider]    No Known Allergies  No family history on file.  Social History Social History   Tobacco Use  . Smoking status: Former Games developermoker  . Smokeless tobacco: Never Used  Substance Use Topics  . Alcohol use: No  . Drug use: No    Review of Systems Constitutional: Negative for fever. Cardiovascular: Chest pain earlier today Respiratory: Negative for shortness of breath. Gastrointestinal: Negative for abdominal pain Genitourinary: Negative for dysuria. Musculoskeletal: Negative for leg pain Neurological: Negative for headache All other ROS negative, although likely limited due to baseline dementia.  ____________________________________________   PHYSICAL EXAM:  VITAL SIGNS:  ED Triage Vitals [10/15/17 1437]  Enc Vitals Group     BP 131/60     Pulse Rate 66     Resp 18     Temp 97.9 F (36.6 C)     Temp Source Oral     SpO2 100 %     Weight 204 lb 9.6 oz (92.8 kg)     Height 6' (1.829 m)     Head Circumference      Peak Flow      Pain Score      Pain Loc      Pain Edu?      Excl. in GC?     Constitutional: Alert, no distress.  Patient oriented to person and place only. Eyes: Normal exam ENT   Head: Normocephalic and  atraumatic   Mouth/Throat: Mucous membranes are moist. Cardiovascular: Normal rate, regular rhythm.  2/6 murmur. Respiratory: Normal respiratory effort without tachypnea nor retractions. Breath sounds are clear  Gastrointestinal: Soft and nontender. No distention.   Musculoskeletal: Nontender, slight contraction of left upper extremity Neurologic:  Normal speech and language. No gross focal neurologic deficits Skin:  Skin is warm, dry and intact.  Psychiatric: Mood and affect are normal. Speech and behavior are normal.   ____________________________________________    EKG  EKG reviewed and interpreted by myself shows normal sinus rhythm at 64 bpm with a widened QRS, mild left axis deviation, normal intervals with nonspecific ST changes.  No ST elevation noted.  ____________________________________________    RADIOLOGY  Chest x-ray negative  ____________________________________________   INITIAL IMPRESSION / ASSESSMENT AND PLAN / ED COURSE  Pertinent labs & imaging results that were available during my care of the patient were reviewed by me and considered in my medical decision making (see chart for details).  Patient presents to the emergency department for reported chest pain earlier today at his nursing facility.  Currently the patient appears well, calm, cooperative, no distress.  Differential would include chest pain, chest wall pain, ACS, pneumonia, pneumothorax.  Overall the patient appears very well with normal vitals, normal physical exam.  We will obtain labs including a troponin, chest x-ray and EKG as a precaution.  Patient has baseline dementia and cannot contribute to his history.  Patient's labs are normal.  Troponin 0.03 we will repeat a troponin as the patient is unable to provide an accurate history.  Chest x-ray is normal.  EKG shows no significant abnormality.  Repeat troponin is largely unchanged.  Patient continues to appear well, continues to deny any  symptoms in the emergency department.  I believe the patient is safe for discharge back to his nursing facility.  ____________________________________________   FINAL CLINICAL IMPRESSION(S) / ED DIAGNOSES  Chest pain    Minna AntisPaduchowski, Zamiyah Resendes, MD 10/15/17 Windell [id number]

## 2017-10-15 NOTE — ED Notes (Signed)
Nurse introducing herself to pt. Nurse explained that he would be going home and that we were organizing his transfer. Pt in NAD.

## 2017-11-17 ENCOUNTER — Other Ambulatory Visit: Payer: Self-pay

## 2017-11-17 ENCOUNTER — Encounter: Payer: Self-pay | Admitting: Emergency Medicine

## 2017-11-17 ENCOUNTER — Emergency Department: Payer: Medicare Other

## 2017-11-17 ENCOUNTER — Emergency Department
Admission: EM | Admit: 2017-11-17 | Discharge: 2017-11-17 | Disposition: A | Discharge status: Home / Self Care | Payer: Medicare Other | Attending: Student in an Organized Health Care Education/Training Program | Admitting: Student in an Organized Health Care Education/Training Program

## 2017-11-17 DIAGNOSIS — Y939 Activity, unspecified: Secondary | ICD-10-CM | POA: Insufficient documentation

## 2017-11-17 DIAGNOSIS — Z8673 Personal history of transient ischemic attack (TIA), and cerebral infarction without residual deficits: Secondary | ICD-10-CM | POA: Insufficient documentation

## 2017-11-17 DIAGNOSIS — R001 Bradycardia, unspecified: Secondary | ICD-10-CM | POA: Diagnosis not present

## 2017-11-17 DIAGNOSIS — Z7902 Long term (current) use of antithrombotics/antiplatelets: Secondary | ICD-10-CM | POA: Diagnosis not present

## 2017-11-17 DIAGNOSIS — Z7982 Long term (current) use of aspirin: Secondary | ICD-10-CM | POA: Diagnosis not present

## 2017-11-17 DIAGNOSIS — E119 Type 2 diabetes mellitus without complications: Secondary | ICD-10-CM | POA: Insufficient documentation

## 2017-11-17 DIAGNOSIS — Z951 Presence of aortocoronary bypass graft: Secondary | ICD-10-CM | POA: Diagnosis not present

## 2017-11-17 DIAGNOSIS — Y92129 Unspecified place in nursing home as the place of occurrence of the external cause: Secondary | ICD-10-CM | POA: Insufficient documentation

## 2017-11-17 DIAGNOSIS — W1839XA Other fall on same level, initial encounter: Secondary | ICD-10-CM | POA: Insufficient documentation

## 2017-11-17 DIAGNOSIS — Z950 Presence of cardiac pacemaker: Secondary | ICD-10-CM | POA: Diagnosis not present

## 2017-11-17 DIAGNOSIS — F0151 Vascular dementia with behavioral disturbance: Secondary | ICD-10-CM | POA: Insufficient documentation

## 2017-11-17 DIAGNOSIS — Y999 Unspecified external cause status: Secondary | ICD-10-CM | POA: Diagnosis not present

## 2017-11-17 DIAGNOSIS — R0602 Shortness of breath: Secondary | ICD-10-CM | POA: Diagnosis present

## 2017-11-17 DIAGNOSIS — R1032 Left lower quadrant pain: Secondary | ICD-10-CM | POA: Diagnosis not present

## 2017-11-17 DIAGNOSIS — I10 Essential (primary) hypertension: Secondary | ICD-10-CM | POA: Diagnosis not present

## 2017-11-17 DIAGNOSIS — Z87891 Personal history of nicotine dependence: Secondary | ICD-10-CM | POA: Insufficient documentation

## 2017-11-17 LAB — COMPREHENSIVE METABOLIC PANEL
ALBUMIN: 3.6 g/dL (ref 3.5–5.0)
ALK PHOS: 105 U/L (ref 38–126)
ALT: 80 U/L — ABNORMAL HIGH (ref 17–63)
ANION GAP: 7 (ref 5–15)
AST: 68 U/L — AB (ref 15–41)
BUN: 17 mg/dL (ref 6–20)
CO2: 26 mmol/L (ref 22–32)
Calcium: 8.7 mg/dL — ABNORMAL LOW (ref 8.9–10.3)
Chloride: 102 mmol/L (ref 101–111)
Creatinine, Ser: 1.4 mg/dL — ABNORMAL HIGH (ref 0.61–1.24)
GFR calc Af Amer: 54 mL/min — ABNORMAL LOW (ref >60)
GFR calc non Af Amer: 46 mL/min — ABNORMAL LOW (ref >60)
GLUCOSE: 96 mg/dL (ref 65–99)
Potassium: 4.2 mmol/L (ref 3.5–5.1)
Sodium: 135 mmol/L (ref 135–145)
Total Bilirubin: 0.6 mg/dL (ref 0.3–1.2)
Total Protein: 7 g/dL (ref 6.5–8.1)

## 2017-11-17 LAB — TROPONIN I

## 2017-11-17 LAB — CBC
HEMATOCRIT: 43.6 % (ref 40.0–52.0)
HEMOGLOBIN: 14.3 g/dL (ref 13.0–18.0)
MCH: 29 pg (ref 26.0–34.0)
MCHC: 32.7 g/dL (ref 32.0–36.0)
MCV: 88.7 fL (ref 80.0–100.0)
Platelets: 172 10*3/uL (ref 150–440)
RBC: 4.91 MIL/uL (ref 4.40–5.90)
RDW: 14.5 % (ref 11.5–14.5)
WBC: 6.6 10*3/uL (ref 3.8–10.6)

## 2017-11-17 LAB — URINALYSIS, COMPLETE (UACMP) WITH MICROSCOPIC
BACTERIA UA: NONE SEEN
Glucose, UA: NEGATIVE mg/dL
Hgb urine dipstick: NEGATIVE
Ketones, ur: NEGATIVE mg/dL
Nitrite: NEGATIVE
Protein, ur: NEGATIVE mg/dL
SPECIFIC GRAVITY, URINE: 1.025 (ref 1.005–1.030)
pH: 6 (ref 5.0–8.0)

## 2017-11-17 LAB — LIPASE, BLOOD: Lipase: 53 U/L — ABNORMAL HIGH (ref 11–51)

## 2017-11-17 MED ORDER — SODIUM CHLORIDE 0.9 % IV BOLUS (SEPSIS)
500.0000 mL | Freq: Once | INTRAVENOUS | Status: AC
  Administered 2017-11-17: 500 mL via INTRAVENOUS

## 2017-11-17 MED ORDER — IOPAMIDOL (ISOVUE-300) INJECTION 61%
100.0000 mL | Freq: Once | INTRAVENOUS | Status: AC | PRN
  Administered 2017-11-17: 100 mL via INTRAVENOUS

## 2017-11-17 NOTE — ED Provider Notes (Signed)
[facility] Emergency Department Provider Note    First MD Initiated Contact with Patient 11/17/17 1125     (approximate)  I have reviewed the triage vital signs and the nursing notes.   HISTORY  Chief Complaint Fall and Abdominal Pain  Level V Caveat:  dementia  HPI Anthony Thornton is a 82 y.o. male presenting from [facility] with a history of dementia with multiple visits to the ER but no recent admissions presents with chief complaint of weakness fatigue and shortness of breath.  No chest pain.  States he was also having left lower quadrant abdominal pain that was moderate to severe but is since resolved.  Patient did have near syncopal episode.  Did not hit his head.  There is no report of loss of consciousness.  States he just felt weak and could no longer stand.  Past Medical History:  Diagnosis Date  . Adjustment disorder with anxiety   . CVA (cerebral vascular accident) (HCC)   . Dementia   . Diabetes mellitus without complication (HCC)   . Glaucoma   . Hypercholesteremia   . Hypertension   . Pacemaker   . PE (pulmonary thromboembolism) (HCC)    No family history on file. Past Surgical History:  Procedure Laterality Date  . AORTIC VALVE REPLACEMENT (AVR)/CORONARY ARTERY BYPASS GRAFTING (CABG)     Patient Active Problem List   Diagnosis Date Noted  . 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 Vascular dementia 08/23/2017      Prior to Admission medications   Medication Sig Start Date End Date Taking? Authorizing Provider  aspirin 81 MG chewable tablet Chew 81 mg by mouth daily.    [provider]  atenolol (TENORMIN) 25 MG tablet Take 12.5 mg by mouth every morning.     [provider]  atorvastatin (LIPITOR) 80 MG tablet Take 80 mg by mouth every evening.    [provider]  clopidogrel (PLAVIX) 75 MG tablet Take 75 mg by mouth daily.    [provider]  divalproex  (DEPAKOTE) 250 MG DR tablet Take 250 mg by mouth 2 (two) times daily.     [provider]  donepezil (ARICEPT) 10 MG tablet Take 10 mg by mouth at bedtime.  01/03/17   [provider]  furosemide (LASIX) 20 MG tablet Take 20 mg by mouth daily. 10/28/16   [provider]  lisinopril (PRINIVIL,ZESTRIL) 5 MG tablet Take 5 mg by mouth daily. 11/29/16   [provider]  OLANZapine (ZYPREXA) 2.5 MG tablet Take 2.5 mg by mouth daily.    [provider]  traZODone (DESYREL) 50 MG tablet Take 50 mg by mouth at bedtime.    [provider]  UNABLE TO FIND Apply 1 mg topically every 8 (eight) hours as needed. LORAZEPAM 1MG /ML TOPICAL GEL  For agitation    [provider]    Allergies Patient has no known allergies.    Social History Social History   Tobacco Use  . Smoking status: Former Games developermoker  . Smokeless tobacco: Never Used  Substance Use Topics  . Alcohol use: No  . Drug use: No    Review of Systems Patient denies headaches, rhinorrhea, blurry vision, numbness, shortness of breath, chest pain, edema, cough, abdominal pain, nausea, vomiting, diarrhea, dysuria, fevers, rashes or hallucinations unless otherwise stated above in HPI. ____________________________________________   PHYSICAL EXAM:  VITAL SIGNS: There were no vitals filed for this visit.  Constitutional: Alert  chronically ill appearing but in no acute distress. Eyes: Conjunctivae are normal.  Head: Atraumatic. Nose: No congestion/rhinnorhea. Mouth/Throat: Mucous membranes are moist.   Neck: No stridor. Painless ROM.  Cardiovascular: Normal rate, regular rhythm. Grossly normal heart sounds.  Good peripheral circulation. Respiratory: Normal respiratory effort.  No retractions. Lungs CTAB. Gastrointestinal: Soft and nontender. No distention. No abdominal bruits. No CVA tenderness. Genitourinary:  Musculoskeletal: No lower extremity tenderness nor edema.  No joint  effusions. Neurologic:  Normal speech and language. No gross focal neurologic deficits are appreciated. No facial droop Skin:  Skin is warm, dry and intact. No rash noted. Psychiatric: Mood and affect are normal. Speech and behavior are normal.  ____________________________________________   LABS (all labs ordered are listed, but only abnormal results are displayed)  No results found for this or any previous visit (from the past 24 hour(s)). ____________________________________________  EKG My review and personal interpretation at Time: 11:28   Indication: fall  Rate: 45  Rhythm: sinus bradycardia Axis: left Other: inferolateral t wave inversions unchanged from previous EKG 10/10/17 ____________________________________________  RADIOLOGY  I personally reviewed all radiographic images ordered to evaluate for the above acute complaints and reviewed radiology reports and findings.  These findings were personally discussed with the patient.  Please see medical record for radiology report.  ____________________________________________   PROCEDURES  Procedure(s) performed:  Procedures    Critical Care performed: no ____________________________________________   INITIAL IMPRESSION / ASSESSMENT AND PLAN / ED COURSE  Pertinent labs & imaging results that were available during my care of the patient were reviewed by me and considered in my medical decision making (see chart for details).  DDX: Bradycardia, sepsis, ACS, dysrhythmia, lecture light abnormality, dehydration  Anthony Thornton is a 82 y.o. who presents to the ED with fall with bradycardia.  Patient arrives in no acute distress.  His abdominal exam soft and benign but patient does report some abdominal pain therefore will order CT imaging.  No evidence of head injury.  Patient denies any headache.  Blood work will be sent for the above differential.  The patient will be placed on continuous pulse oximetry and telemetry for  monitoring.  Laboratory evaluation will be sent to evaluate for the above complaints.     Clinical Course as of Nov 17 1533  Thu Nov 17, 2017  1401 Blood work is reassuring grossly at baseline base but no evidence of pancreatitis.  Patient's repeat abdominal exam is soft and benign.  Heart rate is in the low 50s.  Do suspect some component of bradycardic episode.  Will repeat troponin to evaluate for ACS but largely patient remains hemodynamically stable.  [PR]  1433 She reassessed.  He is mentating appropriately.  Heart rate is 55.  Blood pressure improved.  Patient denies any weakness states he just felt weak earlier this morning after taking medications.  Patient's physician from facility called and was asking about pacemaker presents which it appears that this was removed several years ago.  I do suspect some component of bradycardia related to likely his atenolol.  We will repeat troponin and continue to observe.  [PR]  1532 Troponin is negative.  Urinalysis shows no evidence of infection.  Patient's heart rate remained stable and in the low 60s to high 50s.  At this point I do believe patient is stable and appropriate for discharge back to facility for follow-up with cardiology.  [PR]    Clinical Course User Index [PR] Willy Eddy, MD     ____________________________________________  FINAL CLINICAL IMPRESSION(S) / ED DIAGNOSES  Final diagnoses:  Bradycardia      NEW MEDICATIONS STARTED DURING THIS VISIT:  New Prescriptions   No medications on file     Note:  This document was prepared using Dragon voice recognition software and may include unintentional dictation errors.    Willy Eddy, MD 11/17/17 1537

## 2017-11-17 NOTE — Discharge Instructions (Signed)
I would recommend withholding atenolol as that seems to be lowering your heart rate and blood pressure and is likely putting you at increased risk for syncopal episodes.  Please follow-up with cardiology.  Return to the ER if you have any additional questions or concerns.

## 2017-11-17 NOTE — ED Triage Notes (Signed)
Pt presents to ED via [facility], per EMS pt was found on the floor at [facility] and was put into a chair by staff. Pt also c/o abdominal pain. EMS reports bradycardia and low oxygen levels, however pt is being followed by a cardiologist. Pt states he fell due to "giving out of breath". EMS reports patient is alert and oriented at this time.

## 2018-02-20 ENCOUNTER — Emergency Department: Payer: Medicare Other

## 2018-02-20 ENCOUNTER — Inpatient Hospital Stay
Admission: EM | Admit: 2018-02-20 | Discharge: 2018-02-23 | DRG: 202 | Disposition: A | Discharge status: Nursing Home (Includes ICF) | Payer: Medicare Other | Source: Skilled Nursing Facility | Attending: Family Medicine | Admitting: Family Medicine

## 2018-02-20 ENCOUNTER — Other Ambulatory Visit: Payer: Self-pay

## 2018-02-20 DIAGNOSIS — F4322 Adjustment disorder with anxiety: Secondary | ICD-10-CM | POA: Diagnosis present

## 2018-02-20 DIAGNOSIS — Z7902 Long term (current) use of antithrombotics/antiplatelets: Secondary | ICD-10-CM

## 2018-02-20 DIAGNOSIS — H409 Unspecified glaucoma: Secondary | ICD-10-CM | POA: Diagnosis present

## 2018-02-20 DIAGNOSIS — E872 Acidosis: Secondary | ICD-10-CM | POA: Diagnosis present

## 2018-02-20 DIAGNOSIS — R1110 Vomiting, unspecified: Secondary | ICD-10-CM

## 2018-02-20 DIAGNOSIS — J209 Acute bronchitis, unspecified: Principal | ICD-10-CM | POA: Diagnosis present

## 2018-02-20 DIAGNOSIS — Z86711 Personal history of pulmonary embolism: Secondary | ICD-10-CM | POA: Diagnosis not present

## 2018-02-20 DIAGNOSIS — Z79899 Other long term (current) drug therapy: Secondary | ICD-10-CM

## 2018-02-20 DIAGNOSIS — X58XXXS Exposure to other specified factors, sequela: Secondary | ICD-10-CM | POA: Diagnosis present

## 2018-02-20 DIAGNOSIS — E871 Hypo-osmolality and hyponatremia: Secondary | ICD-10-CM | POA: Diagnosis present

## 2018-02-20 DIAGNOSIS — Z950 Presence of cardiac pacemaker: Secondary | ICD-10-CM | POA: Diagnosis not present

## 2018-02-20 DIAGNOSIS — Z951 Presence of aortocoronary bypass graft: Secondary | ICD-10-CM

## 2018-02-20 DIAGNOSIS — Z7982 Long term (current) use of aspirin: Secondary | ICD-10-CM | POA: Diagnosis not present

## 2018-02-20 DIAGNOSIS — J441 Chronic obstructive pulmonary disease with (acute) exacerbation: Secondary | ICD-10-CM | POA: Diagnosis present

## 2018-02-20 DIAGNOSIS — E7800 Pure hypercholesterolemia, unspecified: Secondary | ICD-10-CM | POA: Diagnosis present

## 2018-02-20 DIAGNOSIS — F0150 Vascular dementia without behavioral disturbance: Secondary | ICD-10-CM | POA: Diagnosis present

## 2018-02-20 DIAGNOSIS — E119 Type 2 diabetes mellitus without complications: Secondary | ICD-10-CM | POA: Diagnosis present

## 2018-02-20 DIAGNOSIS — J440 Chronic obstructive pulmonary disease with acute lower respiratory infection: Secondary | ICD-10-CM | POA: Diagnosis present

## 2018-02-20 DIAGNOSIS — Z8673 Personal history of transient ischemic attack (TIA), and cerebral infarction without residual deficits: Secondary | ICD-10-CM | POA: Diagnosis not present

## 2018-02-20 DIAGNOSIS — Z952 Presence of prosthetic heart valve: Secondary | ICD-10-CM

## 2018-02-20 DIAGNOSIS — I10 Essential (primary) hypertension: Secondary | ICD-10-CM | POA: Diagnosis present

## 2018-02-20 DIAGNOSIS — Z87891 Personal history of nicotine dependence: Secondary | ICD-10-CM

## 2018-02-20 DIAGNOSIS — S0990XS Unspecified injury of head, sequela: Secondary | ICD-10-CM

## 2018-02-20 DIAGNOSIS — F0391 Unspecified dementia with behavioral disturbance: Secondary | ICD-10-CM | POA: Diagnosis present

## 2018-02-20 LAB — GLUCOSE, CAPILLARY
GLUCOSE-CAPILLARY: 167 mg/dL — AB (ref 65–99)
Glucose-Capillary: 85 mg/dL (ref 65–99)
Glucose-Capillary: 126 mg/dL — ABNORMAL HIGH (ref 65–99)

## 2018-02-20 LAB — COMPREHENSIVE METABOLIC PANEL
ALT: 94 U/L — ABNORMAL HIGH (ref 17–63)
AST: 88 U/L — ABNORMAL HIGH (ref 15–41)
Albumin: 3.6 g/dL (ref 3.5–5.0)
Alkaline Phosphatase: 105 U/L (ref 38–126)
Anion gap: 10 (ref 5–15)
BILIRUBIN TOTAL: 0.8 mg/dL (ref 0.3–1.2)
BUN: 14 mg/dL (ref 6–20)
CO2: 25 mmol/L (ref 22–32)
Calcium: 9.2 mg/dL (ref 8.9–10.3)
Chloride: 101 mmol/L (ref 101–111)
Creatinine, Ser: 0.99 mg/dL (ref 0.61–1.24)
Glucose, Bld: 137 mg/dL — ABNORMAL HIGH (ref 65–99)
POTASSIUM: 4.5 mmol/L (ref 3.5–5.1)
Sodium: 136 mmol/L (ref 135–145)
TOTAL PROTEIN: 8 g/dL (ref 6.5–8.1)

## 2018-02-20 LAB — CBC WITH DIFFERENTIAL/PLATELET
BASOS ABS: 0 10*3/uL (ref 0–0.1)
BASOS PCT: 0 %
Eosinophils Absolute: 0 10*3/uL (ref 0–0.7)
Eosinophils Relative: 0 %
HEMATOCRIT: 46 % (ref 40.0–52.0)
Hemoglobin: 15.4 g/dL (ref 13.0–18.0)
LYMPHS PCT: 5 %
Lymphs Abs: 0.6 10*3/uL — ABNORMAL LOW (ref 1.0–3.6)
MCH: 29.7 pg (ref 26.0–34.0)
MCHC: 33.6 g/dL (ref 32.0–36.0)
MCV: 88.4 fL (ref 80.0–100.0)
Monocytes Absolute: 1.1 10*3/uL — ABNORMAL HIGH (ref 0.2–1.0)
Monocytes Relative: 9 %
Neutro Abs: 9.7 10*3/uL — ABNORMAL HIGH (ref 1.4–6.5)
Neutrophils Relative %: 86 %
PLATELETS: 224 10*3/uL (ref 150–440)
RBC: 5.2 MIL/uL (ref 4.40–5.90)
RDW: 14.5 % (ref 11.5–14.5)
WBC: 11.4 10*3/uL — AB (ref 3.8–10.6)

## 2018-02-20 LAB — TROPONIN I

## 2018-02-20 LAB — LACTIC ACID, PLASMA
Lactic Acid, Venous: 4.1 mmol/L (ref 0.5–1.9)
Lactic Acid, Venous: 6.3 mmol/L (ref 0.5–1.9)

## 2018-02-20 LAB — INFLUENZA PANEL BY PCR (TYPE A & B)
INFLAPCR: NEGATIVE
INFLBPCR: NEGATIVE

## 2018-02-20 LAB — LIPASE, BLOOD: LIPASE: 34 U/L (ref 11–51)

## 2018-02-20 LAB — HEMOGLOBIN A1C
HEMOGLOBIN A1C: 5.9 % — AB (ref 4.8–5.6)
MEAN PLASMA GLUCOSE: 122.63 mg/dL

## 2018-02-20 LAB — VALPROIC ACID LEVEL: Valproic Acid Lvl: 53 ug/mL (ref 50.0–100.0)

## 2018-02-20 LAB — MRSA PCR SCREENING: MRSA BY PCR: NEGATIVE

## 2018-02-20 MED ORDER — VANCOMYCIN HCL IN DEXTROSE 1-5 GM/200ML-% IV SOLN
1000.0000 mg | Freq: Once | INTRAVENOUS | Status: DC
  Administered 2018-02-20: 1000 mg via INTRAVENOUS
  Filled 2018-02-20: qty 200

## 2018-02-20 MED ORDER — ATENOLOL 25 MG PO TABS
12.5000 mg | ORAL_TABLET | ORAL | Status: DC
  Administered 2018-02-21: 12.5 mg via ORAL
  Filled 2018-02-20 (×3): qty 0.5

## 2018-02-20 MED ORDER — ONDANSETRON HCL 4 MG PO TABS: 4.0000 mg | ORAL_TABLET | Freq: Four times a day (QID) | ORAL | Status: DC | PRN

## 2018-02-20 MED ORDER — CLOPIDOGREL BISULFATE 75 MG PO TABS
75.0000 mg | ORAL_TABLET | Freq: Every day | ORAL | Status: DC
  Administered 2018-02-20 – 2018-02-23 (×4): 75 mg via ORAL
  Filled 2018-02-20 (×4): qty 1

## 2018-02-20 MED ORDER — LACTATED RINGERS IV SOLN
1000.0000 mL | Freq: Once | INTRAVENOUS | Status: AC
  Administered 2018-02-20: 1000 mL via INTRAVENOUS

## 2018-02-20 MED ORDER — POLYETHYLENE GLYCOL 3350 17 G PO PACK: 17.0000 g | PACK | Freq: Every day | ORAL | Status: DC | PRN

## 2018-02-20 MED ORDER — ACETAMINOPHEN 325 MG PO TABS: 650.0000 mg | ORAL_TABLET | Freq: Four times a day (QID) | ORAL | Status: DC | PRN

## 2018-02-20 MED ORDER — ALBUTEROL SULFATE (2.5 MG/3ML) 0.083% IN NEBU
2.5000 mg | INHALATION_SOLUTION | Freq: Once | RESPIRATORY_TRACT | Status: AC
  Administered 2018-02-20: 2.5 mg via RESPIRATORY_TRACT
  Filled 2018-02-20: qty 3

## 2018-02-20 MED ORDER — ASPIRIN 81 MG PO CHEW
81.0000 mg | CHEWABLE_TABLET | Freq: Every day | ORAL | Status: DC
  Administered 2018-02-20 – 2018-02-23 (×4): 81 mg via ORAL
  Filled 2018-02-20 (×4): qty 1

## 2018-02-20 MED ORDER — FUROSEMIDE 20 MG PO TABS
20.0000 mg | ORAL_TABLET | Freq: Every day | ORAL | Status: DC
  Administered 2018-02-20 – 2018-02-21 (×2): 20 mg via ORAL
  Filled 2018-02-20 (×2): qty 1

## 2018-02-20 MED ORDER — ONDANSETRON HCL 4 MG/2ML IJ SOLN: 4.0000 mg | Freq: Four times a day (QID) | INTRAMUSCULAR | Status: DC | PRN

## 2018-02-20 MED ORDER — METHYLPREDNISOLONE SODIUM SUCC 125 MG IJ SOLR
60.0000 mg | Freq: Two times a day (BID) | INTRAMUSCULAR | Status: DC
  Administered 2018-02-20 – 2018-02-21 (×3): 60 mg via INTRAVENOUS
  Filled 2018-02-20 (×3): qty 2

## 2018-02-20 MED ORDER — SODIUM CHLORIDE 0.9 % IV SOLN
2.0000 g | Freq: Two times a day (BID) | INTRAVENOUS | Status: DC
  Administered 2018-02-20 – 2018-02-23 (×6): 2 g via INTRAVENOUS
  Filled 2018-02-20 (×8): qty 2

## 2018-02-20 MED ORDER — ALBUTEROL SULFATE (2.5 MG/3ML) 0.083% IN NEBU: 2.5000 mg | INHALATION_SOLUTION | RESPIRATORY_TRACT | Status: DC | PRN

## 2018-02-20 MED ORDER — LISINOPRIL 10 MG PO TABS
5.0000 mg | ORAL_TABLET | Freq: Every day | ORAL | Status: DC
  Administered 2018-02-21 – 2018-02-23 (×3): 5 mg via ORAL
  Filled 2018-02-20 (×4): qty 1

## 2018-02-20 MED ORDER — TRAZODONE HCL 50 MG PO TABS
50.0000 mg | ORAL_TABLET | Freq: Every day | ORAL | Status: DC
  Administered 2018-02-20 – 2018-02-22 (×3): 50 mg via ORAL
  Filled 2018-02-20 (×3): qty 1

## 2018-02-20 MED ORDER — IPRATROPIUM-ALBUTEROL 0.5-2.5 (3) MG/3ML IN SOLN
3.0000 mL | Freq: Four times a day (QID) | RESPIRATORY_TRACT | Status: DC
  Administered 2018-02-20 – 2018-02-21 (×4): 3 mL via RESPIRATORY_TRACT
  Filled 2018-02-20 (×5): qty 3

## 2018-02-20 MED ORDER — CEFEPIME HCL 2 G IJ SOLR
2.0000 g | Freq: Once | INTRAMUSCULAR | Status: AC
  Administered 2018-02-20: 2 g via INTRAVENOUS
  Filled 2018-02-20: qty 2

## 2018-02-20 MED ORDER — ACETAMINOPHEN 650 MG RE SUPP: 650.0000 mg | Freq: Four times a day (QID) | RECTAL | Status: DC | PRN

## 2018-02-20 MED ORDER — INSULIN ASPART 100 UNIT/ML ~~LOC~~ SOLN: 0.0000 [IU] | Freq: Every day | SUBCUTANEOUS | Status: DC

## 2018-02-20 MED ORDER — MECLIZINE HCL 12.5 MG PO TABS
12.5000 mg | ORAL_TABLET | Freq: Two times a day (BID) | ORAL | Status: DC
  Administered 2018-02-20 – 2018-02-23 (×6): 12.5 mg via ORAL
  Filled 2018-02-20 (×7): qty 1

## 2018-02-20 MED ORDER — DIVALPROEX SODIUM 125 MG PO DR TAB
125.0000 mg | DELAYED_RELEASE_TABLET | Freq: Every day | ORAL | Status: DC
  Administered 2018-02-20 – 2018-02-23 (×4): 125 mg via ORAL
  Filled 2018-02-20 (×5): qty 1

## 2018-02-20 MED ORDER — OLANZAPINE 5 MG PO TABS
2.5000 mg | ORAL_TABLET | Freq: Every day | ORAL | Status: DC
  Administered 2018-02-20 – 2018-02-23 (×4): 2.5 mg via ORAL
  Filled 2018-02-20 (×4): qty 0.5

## 2018-02-20 MED ORDER — INSULIN ASPART 100 UNIT/ML ~~LOC~~ SOLN
0.0000 [IU] | Freq: Three times a day (TID) | SUBCUTANEOUS | Status: DC
  Administered 2018-02-20: 3 [IU] via SUBCUTANEOUS
  Administered 2018-02-21 – 2018-02-23 (×3): 2 [IU] via SUBCUTANEOUS
  Filled 2018-02-20 (×4): qty 1

## 2018-02-20 MED ORDER — ENOXAPARIN SODIUM 40 MG/0.4ML ~~LOC~~ SOLN
40.0000 mg | SUBCUTANEOUS | Status: DC
  Administered 2018-02-20 – 2018-02-23 (×4): 40 mg via SUBCUTANEOUS
  Filled 2018-02-20 (×4): qty 0.4

## 2018-02-20 MED ORDER — DOCUSATE SODIUM 100 MG PO CAPS
100.0000 mg | ORAL_CAPSULE | Freq: Two times a day (BID) | ORAL | Status: DC
  Administered 2018-02-20 – 2018-02-23 (×7): 100 mg via ORAL
  Filled 2018-02-20 (×7): qty 1

## 2018-02-20 MED ORDER — ATORVASTATIN CALCIUM 20 MG PO TABS
80.0000 mg | ORAL_TABLET | Freq: Every day | ORAL | Status: DC
  Administered 2018-02-20 – 2018-02-22 (×3): 80 mg via ORAL
  Filled 2018-02-20 (×3): qty 4

## 2018-02-20 MED ORDER — DONEPEZIL HCL 5 MG PO TABS
10.0000 mg | ORAL_TABLET | Freq: Every day | ORAL | Status: DC
  Administered 2018-02-20 – 2018-02-22 (×3): 10 mg via ORAL
  Filled 2018-02-20 (×4): qty 2

## 2018-02-20 MED ORDER — IBUPROFEN 400 MG PO TABS: 400.0000 mg | ORAL_TABLET | Freq: Four times a day (QID) | ORAL | Status: DC | PRN

## 2018-02-20 MED ORDER — AZITHROMYCIN 250 MG PO TABS
500.0000 mg | ORAL_TABLET | Freq: Every day | ORAL | Status: DC
  Administered 2018-02-20 – 2018-02-23 (×4): 500 mg via ORAL
  Filled 2018-02-20 (×2): qty 2
  Filled 2018-02-20: qty 1
  Filled 2018-02-20: qty 2

## 2018-02-20 NOTE — ED Notes (Signed)
Date and time results received: 02/20/18 10:24 AM  Test: lactic acid Critical Value: 4.1  Name of Provider Notified: Scaevitz, MD  Orders Received? Or Actions Taken?: see new orders

## 2018-02-20 NOTE — Care Management (Signed)
It is noted that this patient is from [facility].  I see this is also his fifth presentation to hospital. Please consider palliative consult for GOC and PT consult as patient may need higher level of care.

## 2018-02-20 NOTE — Progress Notes (Signed)
Pharmacy Antibiotic Note  Anthony Thornton is a 82 y.o. male admitted on 02/20/2018 with pneumonia.  Pharmacy has been consulted for cefepime dosing.  Plan: Cefepime 2 gm IV Q12H  Height: 6' (182.9 cm) Weight: 200 lb (90.7 kg) IBW/kg (Calculated) : 77.6  Temp (24hrs), Avg:98.3 F (36.8 C), Min:98.3 F (36.8 C), Max:98.3 F (36.8 C)  Recent Labs  Lab 02/20/18 0849 02/20/18 0935  WBC 11.4*  --   CREATININE 0.99  --   LATICACIDVEN  --  4.1*    Estimated Creatinine Clearance: 65.3 mL/min (by C-G formula based on SCr of 0.99 mg/dL).    No Known Allergies  Antimicrobials this admission: Vancomycin x 1 in ED Azithromycin 500 mg po daily per admitting MD  Dose adjustments this admission:   Microbiology results: 4/15 BCx: pending  UCx:    Sputum:    MRSA PCR:   Thank you for allowing pharmacy to be a part of this patient's care.  Anthony Thornton, Pharm.D., BCPS Clinical Pharmacist 02/20/2018 12:00 PM

## 2018-02-20 NOTE — H&P (Signed)
[facility] [facility] [place] [facility]   PATIENT NAME: Anthony Thornton    MR#:  [id number]  DATE OF BIRTH:  09/14/1936  DATE OF ADMISSION:  02/20/2018  PRIMARY CARE PHYSICIAN: Patient, No Pcp Per   REQUESTING/REFERRING PHYSICIAN: Dr. Raynelle CharySchavitz  CHIEF COMPLAINT:   Chief Complaint  Patient presents with  . Emesis    HISTORY OF PRESENT ILLNESS:  Anthony Thornton  is a 82 y.o. male with a known history of dementia, hypertension, diabetes mellitus presents from nursing home due to one episode of vomiting.  Patient has also had wheezing and some shortness of breath.  Here in the emergency room he has been found to have bilateral significant wheezing with no improvement with nebulizers.  Also lactic acid 4.1.  Chest x-ray clear. Patient is poor historian.  He mentions he is here because he fell.  But no history of falling at the nursing home.  He does not remember that he had one episode of vomiting. Patient given 1 dose of vancomycin, cefepime and IV fluids in the ER.  PAST MEDICAL HISTORY:   Past Medical History:  Diagnosis Date  . Adjustment disorder with anxiety   . CVA (cerebral vascular accident) (HCC)   . Dementia   . Diabetes mellitus without complication (HCC)   . Glaucoma   . Hypercholesteremia   . Hypertension   . Pacemaker   . PE (pulmonary thromboembolism) (HCC)     PAST SURGICAL HISTORY:   Past Surgical History:  Procedure Laterality Date  . AORTIC VALVE REPLACEMENT (AVR)/CORONARY ARTERY BYPASS GRAFTING (CABG)      SOCIAL HISTORY:   Social History   Tobacco Use  . Smoking status: Former Games developermoker  . Smokeless tobacco: Never Used  Substance Use Topics  . Alcohol use: No    FAMILY HISTORY:  History reviewed. No pertinent family history. Unable to obtain due to patient's dementia  DRUG ALLERGIES:  No Known Allergies  REVIEW OF SYSTEMS:   Review of Systems  Unable to perform ROS: Dementia    MEDICATIONS AT HOME:   Prior to Admission  medications   Medication Sig Start Date End Date Taking? Authorizing Provider  aspirin 81 MG chewable tablet Chew 81 mg by mouth daily.    [provider]  atenolol (TENORMIN) 25 MG tablet Take 12.5 mg by mouth every morning.     [provider]  atorvastatin (LIPITOR) 80 MG tablet Take 80 mg by mouth daily.    [provider]  carvedilol (COREG) 3.125 MG tablet Take 3.125 mg by mouth 2 (two) times daily with a meal.    [provider]  clopidogrel (PLAVIX) 75 MG tablet Take 75 mg by mouth daily.    [provider]  divalproex (DEPAKOTE) 125 MG DR tablet Take 125 mg by mouth daily.    [provider]  donepezil (ARICEPT) 10 MG tablet Take 10 mg by mouth at bedtime.    [provider]  furosemide (LASIX) 20 MG tablet Take 20 mg by mouth daily. 10/28/16   [provider]  lisinopril (PRINIVIL,ZESTRIL) 5 MG tablet Take 5 mg by mouth daily. 11/29/16   [provider]  meclizine (ANTIVERT) 12.5 MG tablet Take 12.5 mg by mouth 2 (two) times daily.    [provider]  OLANZapine (ZYPREXA) 2.5 MG tablet Take 2.5 mg by mouth daily.    [provider]  sulfamethoxazole-trimethoprim (BACTRIM DS,SEPTRA DS) 800-160 MG tablet Take 1 tablet by mouth 2 (two) times daily.  [provider]  traZODone (DESYREL) 50 MG tablet Take 50 mg by mouth at bedtime.    [provider]  UNABLE TO FIND Apply 1 mg topically every 8 (eight) hours as needed. LORAZEPAM 1MG /ML TOPICAL GEL  For agitation    [provider]     VITAL SIGNS:  Blood pressure 110/85, pulse 85, temperature 98.3 F (36.8 C), temperature source Oral, resp. rate 18, height 6' (1.829 m), weight 90.7 kg (200 lb), SpO2 99 %.  PHYSICAL EXAMINATION:  Physical Exam  GENERAL:  82 y.o.-year-old patient lying in the bed with audible wheezing.  Pleasantly confused EYES: Pupils equal, round, reactive to light and accommodation. No  scleral icterus. Extraocular muscles intact.  HEENT: Head atraumatic, normocephalic. Oropharynx and nasopharynx clear. No oropharyngeal erythema, moist oral mucosa  NECK:  Supple, no jugular venous distention. No thyroid enlargement, no tenderness.  LUNGS: Bilateral wheezing and decreased air entry CARDIOVASCULAR: S1, S2 normal. No murmurs, rubs, or gallops.  ABDOMEN: Soft, nontender, nondistended. Bowel sounds present. No organomegaly or mass.  EXTREMITIES: No pedal edema, cyanosis, or clubbing. + 2 pedal & radial pulses b/l.   NEUROLOGIC: Cranial nerves II through XII are intact. No focal Motor or sensory deficits appreciated b/l PSYCHIATRIC: The patient is alert and awake SKIN: No obvious rash, lesion, or ulcer.   LABORATORY PANEL:   CBC Recent Labs  Lab 02/20/18 0849  WBC 11.4*  HGB 15.4  HCT 46.0  PLT 224   ------------------------------------------------------------------------------------------------------------------  Chemistries  Recent Labs  Lab 02/20/18 0849  NA 136  K 4.5  CL 101  CO2 25  GLUCOSE 137*  BUN 14  CREATININE 0.99  CALCIUM 9.2  AST 88*  ALT 94*  ALKPHOS 105  BILITOT 0.8   ------------------------------------------------------------------------------------------------------------------  Cardiac Enzymes Recent Labs  Lab 02/20/18 0849  TROPONINI <0.03   ------------------------------------------------------------------------------------------------------------------  RADIOLOGY:  Dg Chest 2 View  Result Date: 02/20/2018 CLINICAL DATA:  Shortness of breath.  Hypertension. EXAM: CHEST - 2 VIEW COMPARISON:  November 17, 2017 FINDINGS: There is no edema or consolidation. The heart size and pulmonary vascularity are normal. Patient is status post coronary artery bypass grafting. There is aortic atherosclerosis. No evident bone lesions. IMPRESSION: No edema or consolidation.  Aortic atherosclerosis present. Aortic Atherosclerosis (ICD10-I70.0).  Electronically Signed   By: Bretta Bang III M.D.   On: 02/20/2018 09:09     IMPRESSION AND PLAN:   *Acute bronchitis with elevated lactic acid of 4.1.  At this time chest x-ray does not show any infiltrates.  He has been started on broad-spectrum antibiotics with cefepime and vancomycin in the emergency room due to concern for sepsis.  Sepsis not present on admission.  Will discontinue vancomycin.  Continue cefepime and add azithromycin.  Add IV steroids and scheduled nebulizers.  Check influenza PCR.  Start Tamiflu if PCR positive. IV fluid   *Dementia.  Monitor for inpatient delirium.  *Diabetes mellitus.  Sliding scale insulin.  Diabetic diet.  *DVT prophylaxis with Lovenox  All the records are reviewed and case discussed with ED provider. Management plans discussed with the patient, family and they are in agreement.  CODE STATUS: Full code  TOTAL TIME TAKING CARE OF THIS PATIENT: 40 minutes.   Orie Fisherman M.D on 02/20/2018 at 11:47 AM  Between 7am to 6pm - Pager - [phone]  After 6pm go to [email or web address] - password [email or web address] [facility]  [facility] [place] [facility]  Office  [phone]  CC: Primary care physician; Patient, No Pcp Per  Note:  This dictation was prepared with Dragon dictation along with smaller phrase technology. Any transcriptional errors that result from this process are unintentional.

## 2018-02-20 NOTE — ED Provider Notes (Signed)
[facility] Emergency Department Provider Note  ____________________________________________   First MD Initiated Contact with Patient 02/20/18 [phone]     (approximate)  I have reviewed the triage vital signs and the nursing notes.   HISTORY  Chief Complaint Emesis   HPI Anthony Thornton is a 82 y.o. male with a history of diabetes as well as CVA, pulmonary embolus from memory unit who is presenting to the emergency department with vomiting.  Also with cough over the past week.  Patient unable to give a complete history of the detailed events over the past week.  However, he denies any nausea or pain at this time.  No blood was noted in the vomitus.   Past Medical History:  Diagnosis Date  . Adjustment disorder with anxiety   . CVA (cerebral vascular accident) (HCC)   . Dementia   . Diabetes mellitus without complication (HCC)   . Glaucoma   . Hypercholesteremia   . Hypertension   . Pacemaker   . PE (pulmonary thromboembolism) [facility](HCC)     Patient Active Problem List   Diagnosis Date Noted  . 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 Vascular dementia 08/23/2017    Past Surgical History:  Procedure Laterality Date  . AORTIC VALVE REPLACEMENT (AVR)/CORONARY ARTERY BYPASS GRAFTING (CABG)      Prior to Admission medications   Medication Sig Start Date End Date Taking? Authorizing Provider  aspirin 81 MG chewable tablet Chew 81 mg by mouth daily.    [provider]  atenolol (TENORMIN) 25 MG tablet Take 12.5 mg by mouth every morning.     [provider]  atorvastatin (LIPITOR) 80 MG tablet Take 80 mg by mouth daily.    [provider]  carvedilol (COREG) 3.125 MG tablet Take 3.125 mg by mouth 2 (two) times daily with a meal.    [provider]  clopidogrel (PLAVIX) 75 MG tablet Take 75 mg by mouth daily.    [provider]  divalproex (DEPAKOTE) 125 MG DR tablet Take 125 mg by  mouth daily.    [provider]  donepezil (ARICEPT) 10 MG tablet Take 10 mg by mouth at bedtime.    [provider]  furosemide (LASIX) 20 MG tablet Take 20 mg by mouth daily. 10/28/16   [provider]  lisinopril (PRINIVIL,ZESTRIL) 5 MG tablet Take 5 mg by mouth daily. 11/29/16   [provider]  meclizine (ANTIVERT) 12.5 MG tablet Take 12.5 mg by mouth 2 (two) times daily.    [provider]  OLANZapine (ZYPREXA) 2.5 MG tablet Take 2.5 mg by mouth daily.    [provider]  sulfamethoxazole-trimethoprim (BACTRIM DS,SEPTRA DS) 800-160 MG tablet Take 1 tablet by mouth 2 (two) times daily.    [provider]  traZODone (DESYREL) 50 MG tablet Take 50 mg by mouth at bedtime.    [provider]  UNABLE TO FIND Apply 1 mg topically every 8 (eight) hours as needed. LORAZEPAM 1MG /ML TOPICAL GEL  For agitation    [provider]    Allergies Patient has no known allergies.  History reviewed. No pertinent family history.  Social History Social History   Tobacco Use  . Smoking status: Former Games developermoker  . Smokeless tobacco: Never Used  Substance Use Topics  . Alcohol use: No  . Drug use: No    Review of Systems  Constitutional: No fever/chills Eyes: No visual changes. ENT: No sore throat. Cardiovascular: Denies  chest pain. Respiratory: As above  gastrointestinal: No abdominal pain.  no diarrhea.  No constipation. Genitourinary: Negative for dysuria. Musculoskeletal: Negative for back pain. Skin: Negative for rash. Neurological: Negative for headaches, focal weakness or numbness.   ____________________________________________   PHYSICAL EXAM:  VITAL SIGNS: ED Triage Vitals  Enc Vitals Group     BP 02/20/18 0843 (!) 146/70     Pulse Rate 02/20/18 0843 89     Resp --      Temp 02/20/18 0843 98.3 F (36.8 C)     Temp Source 02/20/18 0843 Oral     SpO2 02/20/18 0838 98 %     Weight 02/20/18 0848  200 lb (90.7 kg)     Height 02/20/18 0848 6' (1.829 m)     Head Circumference --      Peak Flow --      Pain Score --      Pain Loc --      Pain Edu? --      Excl. in GC? --     Constitutional: Alert and oriented. Well appearing and in no acute distress. Eyes: Conjunctivae are normal.  Head: Atraumatic. Nose: No congestion/rhinnorhea. Mouth/Throat: Mucous membranes are moist.  Neck: No stridor.   Cardiovascular: Normal rate, regular rhythm. Grossly normal heart sounds.   Respiratory: Appears mildly tachypneic.  Right lower field with mild rhonchi. Gastrointestinal: Soft and nontender. No distention. No CVA tenderness. Musculoskeletal: No lower extremity tenderness nor edema.  No joint effusions. Neurologic:  Normal speech and language. No gross focal neurologic deficits are appreciated. Skin:  Skin is warm, dry and intact. No rash noted. Psychiatric: Mood and affect are normal. Speech and behavior are normal.  ____________________________________________   LABS (all labs ordered are listed, but only abnormal results are displayed)  Labs Reviewed  CULTURE, BLOOD (ROUTINE X 2)  CULTURE, BLOOD (ROUTINE X 2)  COMPREHENSIVE METABOLIC PANEL  CBC WITH DIFFERENTIAL/PLATELET  URINALYSIS, ROUTINE W REFLEX MICROSCOPIC  LACTIC ACID, PLASMA  LACTIC ACID, PLASMA  TROPONIN I  LIPASE, BLOOD   ____________________________________________  EKG  ED ECG REPORT I, Arelia Longest, the attending physician, personally viewed and interpreted this ECG.   Date: 02/20/2018  EKG Time: 0843  Rate: 87  Rhythm: normal sinus rhythm  Axis: Normal  Intervals: Right bundle branch block with left anterior fascicular block  ST&T Change: No ST segment elevation or depression.  T wave inversions in V2 as well as V3. No significant change from previous ____________________________________________  RADIOLOGY  Chest x-ray without acute  disease ____________________________________________   PROCEDURES  Procedure(s) performed:   Procedures  Critical Care performed:   ____________________________________________   INITIAL IMPRESSION / ASSESSMENT AND PLAN / ED COURSE  Pertinent labs & imaging results that were available during my care of the patient were reviewed by me and considered in my medical decision making (see chart for details).  Differential includes, but is not limited to, viral syndrome, bronchitis including COPD exacerbation, pneumonia, reactive airway disease including asthma, CHF including exacerbation with or without pulmonary/interstitial edema, pneumothorax, ACS, thoracic trauma, and pulmonary embolism. As part of my medical decision making, I reviewed the following data within the electronic MEDICAL RECORD NUMBER Notes from prior ED visits  Patient persistently wheezing despite nebulizer treatments.  Elevated lactic acid.  Will be admitted to the hospital.  Signed out to Dr. Elpidio Anis. ____________________________________________   FINAL CLINICAL IMPRESSION(S) / ED DIAGNOSES  Bronchitis.  Emesis.    NEW MEDICATIONS STARTED DURING THIS VISIT:  New  Prescriptions   No medications on file     Note:  This document was prepared using Dragon voice recognition software and may include unintentional dictation errors.     Myrna Blazer, MD 02/22/18 1524

## 2018-02-20 NOTE — ED Triage Notes (Signed)
Pt to ED from [facility] via [facility] c/o vomiting. Per EMS the staff at [facility] reports patient was eating breakfast and projectile vomited x1. EMS also states the staff reported patient has been sick with a productive cough and wheezing in lower bases. Patient has hx dementia. No acute distress noted at this time.

## 2018-02-20 NOTE — ED Notes (Signed)
Pt cleaned up and repositioned in bed. Pt given warm blankets and resting comfortably at this time

## 2018-02-21 LAB — URINALYSIS, ROUTINE W REFLEX MICROSCOPIC
BILIRUBIN URINE: NEGATIVE
Glucose, UA: NEGATIVE mg/dL
Hgb urine dipstick: NEGATIVE
Ketones, ur: 5 mg/dL — AB
LEUKOCYTES UA: NEGATIVE
NITRITE: NEGATIVE
Protein, ur: NEGATIVE mg/dL
SPECIFIC GRAVITY, URINE: 1.019 (ref 1.005–1.030)
pH: 6 (ref 5.0–8.0)

## 2018-02-21 LAB — BASIC METABOLIC PANEL
ANION GAP: 7 (ref 5–15)
BUN: 21 mg/dL — ABNORMAL HIGH (ref 6–20)
CHLORIDE: 97 mmol/L — AB (ref 101–111)
CO2: 23 mmol/L (ref 22–32)
Calcium: 8.6 mg/dL — ABNORMAL LOW (ref 8.9–10.3)
Creatinine, Ser: 0.97 mg/dL (ref 0.61–1.24)
GFR calc Af Amer: >60 mL/min (ref >60)
GLUCOSE: 121 mg/dL — AB (ref 65–99)
POTASSIUM: 3.9 mmol/L (ref 3.5–5.1)
SODIUM: 127 mmol/L — AB (ref 135–145)

## 2018-02-21 LAB — CBC
HEMATOCRIT: 39.9 % — AB (ref 40.0–52.0)
HEMOGLOBIN: 13.5 g/dL (ref 13.0–18.0)
MCH: 29.9 pg (ref 26.0–34.0)
MCHC: 33.7 g/dL (ref 32.0–36.0)
MCV: 88.8 fL (ref 80.0–100.0)
Platelets: 211 10*3/uL (ref 150–440)
RBC: 4.5 MIL/uL (ref 4.40–5.90)
RDW: 14.5 % (ref 11.5–14.5)
WBC: 22.5 10*3/uL — AB (ref 3.8–10.6)

## 2018-02-21 LAB — GLUCOSE, CAPILLARY
GLUCOSE-CAPILLARY: 62 mg/dL — AB (ref 65–99)
GLUCOSE-CAPILLARY: 123 mg/dL — AB (ref 65–99)
GLUCOSE-CAPILLARY: 163 mg/dL — AB (ref 65–99)
Glucose-Capillary: 109 mg/dL — ABNORMAL HIGH (ref 65–99)
Glucose-Capillary: 126 mg/dL — ABNORMAL HIGH (ref 65–99)

## 2018-02-21 LAB — LACTIC ACID, PLASMA
LACTIC ACID, VENOUS: 2.7 mmol/L — AB (ref 0.5–1.9)
LACTIC ACID, VENOUS: 3.4 mmol/L — AB (ref 0.5–1.9)

## 2018-02-21 MED ORDER — METHYLPREDNISOLONE SODIUM SUCC 125 MG IJ SOLR
60.0000 mg | Freq: Four times a day (QID) | INTRAMUSCULAR | Status: DC
  Administered 2018-02-21 – 2018-02-23 (×9): 60 mg via INTRAVENOUS
  Filled 2018-02-21 (×9): qty 2

## 2018-02-21 MED ORDER — BUDESONIDE 0.5 MG/2ML IN SUSP
0.5000 mg | Freq: Two times a day (BID) | RESPIRATORY_TRACT | Status: DC
  Administered 2018-02-22 – 2018-02-23 (×2): 0.5 mg via RESPIRATORY_TRACT
  Filled 2018-02-21 (×3): qty 2

## 2018-02-21 MED ORDER — GUAIFENESIN-DM 100-10 MG/5ML PO SYRP
5.0000 mL | ORAL_SOLUTION | ORAL | Status: DC | PRN
  Administered 2018-02-21: 5 mL via ORAL
  Filled 2018-02-21 (×2): qty 5

## 2018-02-21 MED ORDER — IPRATROPIUM-ALBUTEROL 0.5-2.5 (3) MG/3ML IN SOLN
3.0000 mL | Freq: Three times a day (TID) | RESPIRATORY_TRACT | Status: DC
  Administered 2018-02-22 – 2018-02-23 (×4): 3 mL via RESPIRATORY_TRACT
  Filled 2018-02-21 (×5): qty 3

## 2018-02-21 MED ORDER — SODIUM CHLORIDE 0.9 % IV SOLN
INTRAVENOUS | Status: AC
  Administered 2018-02-21: 13:00:00 via INTRAVENOUS

## 2018-02-21 NOTE — Progress Notes (Signed)
[employer] [facility] - [place] at [facility]   PATIENT NAME: Anthony Thornton    MR#:  [id number]  DATE OF BIRTH:  1936-03-17  SUBJECTIVE:  CHIEF COMPLAINT:   Chief Complaint  Patient presents with  . Emesis  Patient is confused and disoriented, poor historian  REVIEW OF SYSTEMS:  CONSTITUTIONAL: No fever, fatigue or weakness.  EYES: No blurred or double vision.  EARS, NOSE, AND THROAT: No tinnitus or ear pain.  RESPIRATORY: No cough, shortness of breath, wheezing or hemoptysis.  CARDIOVASCULAR: No chest pain, orthopnea, edema.  GASTROINTESTINAL: No nausea, vomiting, diarrhea or abdominal pain.  GENITOURINARY: No dysuria, hematuria.  ENDOCRINE: No polyuria, nocturia,  HEMATOLOGY: No anemia, easy bruising or bleeding SKIN: No rash or lesion. MUSCULOSKELETAL: No joint pain or arthritis.   NEUROLOGIC: No tingling, numbness, weakness.  PSYCHIATRY: No anxiety or depression.   ROS  DRUG ALLERGIES:  No Known Allergies  VITALS:  Blood pressure 118/64, pulse 86, temperature 97.8 F (36.6 C), temperature source Oral, resp. rate 20, height 6' (1.829 m), weight 90.8 kg (200 lb 2.8 oz), SpO2 100 %.  PHYSICAL EXAMINATION:  GENERAL:  82 y.o.-year-old patient lying in the bed with no acute distress.  EYES: Pupils equal, round, reactive to light and accommodation. No scleral icterus. Extraocular muscles intact.  HEENT: Head atraumatic, normocephalic. Oropharynx and nasopharynx clear.  NECK:  Supple, no jugular venous distention. No thyroid enlargement, no tenderness.  LUNGS: Diminished breath sounds with diffuse rhonchi/wheezing. No use of accessory muscles of respiration.  CARDIOVASCULAR: S1, S2 normal. No murmurs, rubs, or gallops.  ABDOMEN: Soft, nontender, nondistended. Bowel sounds present. No organomegaly or mass.  EXTREMITIES: No pedal edema, cyanosis, or clubbing.  NEUROLOGIC: Cranial nerves II through XII are intact. MAES.  PSYCHIATRIC: The patient is awake, alert, confused  and disoriented   SKIN: No obvious rash, lesion, or ulcer.   Physical Exam LABORATORY PANEL:   CBC Recent Labs  Lab 02/20/18 0849  WBC 11.4*  HGB 15.4  HCT 46.0  PLT 224   ------------------------------------------------------------------------------------------------------------------  Chemistries  Recent Labs  Lab 02/20/18 0849  NA 136  K 4.5  CL 101  CO2 25  GLUCOSE 137*  BUN 14  CREATININE 0.99  CALCIUM 9.2  AST 88*  ALT 94*  ALKPHOS 105  BILITOT 0.8   ------------------------------------------------------------------------------------------------------------------  Cardiac Enzymes Recent Labs  Lab 02/20/18 0849  TROPONINI <0.03   ------------------------------------------------------------------------------------------------------------------  RADIOLOGY:  Dg Chest 2 View  Result Date: 02/20/2018 CLINICAL DATA:  Shortness of breath.  Hypertension. EXAM: CHEST - 2 VIEW COMPARISON:  November 17, 2017 FINDINGS: There is no edema or consolidation. The heart size and pulmonary vascularity are normal. Patient is status post coronary artery bypass grafting. There is aortic atherosclerosis. No evident bone lesions. IMPRESSION: No edema or consolidation.  Aortic atherosclerosis present. Aortic Atherosclerosis (ICD10-I70.0). Electronically Signed   By: Bretta Bang III M.D.   On: 02/20/2018 09:09    ASSESSMENT AND PLAN:  *Acute probable COPD exacerbation  Stable  Continue IV Solu-Medrol with tapering as tolerated, empiric cefepime/azithromycin, aggressive pulmonary toilet with bronchodilator therapy, add inhaled corticosteroids, check sputum cultures, respiratory therapy to see, speech therapy to evaluate swallowing given dementia, and continue close medical monitoring  Influenza negative  *Acute lactic acidosis  Most likely secondary to above  IV fluids for rehydration and repeat lactic acid until normal   *Dementia, chronic Increase nursing care as  needed, aspiration/fall/skin care precautions while in house Monitor for inpatient delirium.  *Diabetes mellitus, chronic Stable on  current regiment  Disposition back to assisted living facility in 1-2 days pending clinical course  All the records are reviewed and case discussed with Care Management/Social Workerr. Management plans discussed with the patient, family and they are in agreement.  CODE STATUS: full  TOTAL TIME TAKING CARE OF THIS PATIENT: 35 minutes.     POSSIBLE D/C IN 1-2 DAYS, DEPENDING ON CLINICAL CONDITION.   Evelena AsaMontell D Madolyn Ackroyd M.D on 02/21/2018   Between 7am to 6pm - Pager - [phone]  After 6pm go to [email or web address] - password [email or web address]  [facility] [place] [facility]  Office  [phone]  CC: Primary care physician; Patient, No Pcp Per  Note: This dictation was prepared with Dragon dictation along with smaller phrase technology. Any transcriptional errors that result from this process are unintentional.

## 2018-02-22 LAB — GLUCOSE, CAPILLARY
GLUCOSE-CAPILLARY: 114 mg/dL — AB (ref 65–99)
GLUCOSE-CAPILLARY: 152 mg/dL — AB (ref 65–99)
Glucose-Capillary: 140 mg/dL — ABNORMAL HIGH (ref 65–99)
Glucose-Capillary: 145 mg/dL — ABNORMAL HIGH (ref 65–99)

## 2018-02-22 LAB — LACTIC ACID, PLASMA
LACTIC ACID, VENOUS: 2.7 mmol/L — AB (ref 0.5–1.9)
Lactic Acid, Venous: 2.8 mmol/L (ref 0.5–1.9)

## 2018-02-22 MED ORDER — SODIUM CHLORIDE 0.9 % IV BOLUS
500.0000 mL | Freq: Once | INTRAVENOUS | Status: AC
  Administered 2018-02-22: 500 mL via INTRAVENOUS

## 2018-02-22 MED ORDER — SODIUM CHLORIDE 0.9 % IV SOLN
INTRAVENOUS | Status: DC
  Administered 2018-02-22: 10:00:00 via INTRAVENOUS

## 2018-02-22 NOTE — Clinical Social Work Note (Signed)
Clinical Social Work Assessment  Patient Details  Name: Anthony Thornton MRN: [id number] Date of Birth: 01/30/1936  Date of referral:  02/22/18               Reason for consult:  Discharge Planning                Permission sought to share information with:    Permission granted to share information::     Name::        Agency::     Relationship::     Contact Information:     Housing/Transportation Living arrangements for the past 2 months:  Assisted Living Facility(Memory Care) Source of Information:  Spouse Patient Interpreter Needed:  None Criminal Activity/Legal Involvement Pertinent to Current Situation/Hospitalization:  No - Comment as needed Significant Relationships:  Spouse Lives with:  Facility Resident Do you feel safe going back to the place where you live?  Yes Need for family participation in patient care:  Yes (Comment)  Care giving concerns:  Patient resides long term at [facility] memory care.    Social Worker assessment / plan:  CSW spoke with Doug at [facility] and they can take patient back at discharge. CSW spoke with patient's wife via phone and she confirmed that she does wish for patient to return to [facility] when time. She is thinking at this time that she wants patient to transport via EMS.   Employment status:  Retired Database administratornsurance information:  Managed Medicare PT Recommendations:  Not assessed at this time Information / Referral to community resources:     Patient/Family's Response to care:  Patient's wife expressed appreciation for CSW assistance.  Patient/Family's Understanding of and Emotional Response to Diagnosis, Current Treatment, and Prognosis:  Patient's wife was disappointed that patient was to discharge on 1 to 2 days because she stated he was wanting to get back to "home." She expressed understanding as to why he needed to stay though.  Emotional Assessment Appearance:  Appears stated age Attitude/Demeanor/Rapport:  (pleasantly  confused and talking on phone upon CSW entering room thus CSW called his wife) Affect (typically observed):  Calm Orientation:  Oriented to Self Alcohol / Substance use:  Not Applicable Psych involvement (Current and /or in the community):  No (Comment)  Discharge Needs  Concerns to be addressed:  Care Coordination Readmission within the last 30 days:  No Current discharge risk:  None Barriers to Discharge:  No Barriers Identified   York SpanielMonica Pao Haffey, LCSW 02/22/2018, 11:37 AM

## 2018-02-22 NOTE — Progress Notes (Signed)
Patient explained that he fell in the hallway and has been in the hospital since last Thursday. He expressed a desire to speak with his doctor.  The request was given to his nurse. Chaplain provided active listening and emotional response.

## 2018-02-22 NOTE — Evaluation (Signed)
Clinical/Bedside Swallow Evaluation Patient Details  Name: Anthony Thornton MRN: [id number] Date of Birth: Dec 29, 1935  Today's Date: 02/22/2018 Time: SLP Start Time (ACUTE ONLY): 1050 SLP Stop Time (ACUTE ONLY): 1150 SLP Time Calculation (min) (ACUTE ONLY): 60 min  Past Medical History:  Past Medical History:  Diagnosis Date  . Adjustment disorder with anxiety   . CVA (cerebral vascular accident) (HCC)   . Dementia   . Diabetes mellitus without complication (HCC)   . Glaucoma   . Hypercholesteremia   . Hypertension   . Pacemaker   . PE (pulmonary thromboembolism) (HCC)    Past Surgical History:  Past Surgical History:  Procedure Laterality Date  . AORTIC VALVE REPLACEMENT (AVR)/CORONARY ARTERY BYPASS GRAFTING (CABG)     HPI:  Pt is a 82 y.o. male with a known history of Dementia, hypertension, diabetes mellitus presents from nursing home due to one episode of vomiting.  Patient has also had wheezing and some shortness of breath.  Here in the emergency room he has been found to have bilateral significant wheezing with no improvement with nebulizers.  Also lactic acid 4.1.  Chest x-ray clear.  Patient resides long term at [facility] memory care.  CXR, and recent ones since Dec. 2018, have been clear w/ no active diseases, edema or consolidation.    Assessment / Plan / Recommendation Clinical Impression  Pt appears to present w/ no overt oropharyngeal phase dysphagia and is at reduced risk for prandial aspiration. Pt appeared to present w/ adequate orohparyngeal phase swallow function. However, he does have baseline Dementia which can impact overall awareness w/ oral intake. Pt was given min setup then demonstrated no overt s/s of aspiration w/ the severeal trials of purees, soft solids and thin liquids via straw, cup; clear vocal quality post trials and no decline in respiratory status post trials. Oral phase appeared wfl for bolus management of food/liquid consistencies given. Pt fed  self w/ support and setup for prepping food pieces(cut meats would be helpful for pt for self-feeding). Recommend continue a regular diet w/ (CUT meats/foods) diet w/ thin liquids; general aspiration precautions; PIlls whole in puree for easier swallowing if needed. Education given; NSG updated. Pt does not appear to need further skilled ST dysphagia services.  SLP Visit Diagnosis: Dysphagia, unspecified (R13.10)    Aspiration Risk  (reduced w/ general precautions)    Diet Recommendation  Regular w/ CUT meats/foods(smaller pieces); Thin liquids. General aspiration precautions. Tray setup as needed; reduce distractions at meals.  Medication Administration: Whole meds with puree(IF needed for easier, safer swallowing)    Other  Recommendations Recommended Consults: (Dietician f/u ) Oral Care Recommendations: Oral care BID;Staff/trained caregiver to provide oral care;Patient independent with oral care Other Recommendations: (n/a)   Follow up Recommendations None      Frequency and Duration (n/a)  (n/a)       Prognosis Prognosis for Safe Diet Advancement: Good Barriers to Reach Goals: Cognitive deficits      Swallow Study   General Date of Onset: 02/20/18 HPI: Pt is a 82 y.o. male with a known history of Dementia, hypertension, diabetes mellitus presents from nursing home due to one episode of vomiting.  Patient has also had wheezing and some shortness of breath.  Here in the emergency room he has been found to have bilateral significant wheezing with no improvement with nebulizers.  Also lactic acid 4.1.  Chest x-ray clear.  Patient resides long term at [facility] memory care.  CXR, and recent ones since  Dec. 2018, have been clear w/ no active diseases, edema or consolidation.  Type of Study: Bedside Swallow Evaluation Previous Swallow Assessment: none reported Diet Prior to this Study: Regular;Thin liquids Temperature Spikes Noted: No(wbc elevated since admission; Lactic acid  elevated) Respiratory Status: Room air History of Recent Intubation: No Behavior/Cognition: Alert;Cooperative;Pleasant mood;Distractible;Requires cueing Oral Cavity Assessment: Within Functional Limits Oral Care Completed by SLP: Recent completion by staff Oral Cavity - Dentition: Adequate natural dentition;Missing dentition(few) Vision: Functional for self-feeding Self-Feeding Abilities: Able to feed self;Needs assist;Needs set up Patient Positioning: Upright in bed Baseline Vocal Quality: Normal Volitional Cough: Strong Volitional Swallow: Able to elicit    Oral/Motor/Sensory Function Overall Oral Motor/Sensory Function: Within functional limits   Ice Chips Ice chips: Within functional limits Presentation: Spoon(fed; 2 trials)   Thin Liquid Thin Liquid: Within functional limits Presentation: Cup;Self Fed;Straw(~4 ozs+)    Nectar Thick Nectar Thick Liquid: Not tested   Honey Thick Honey Thick Liquid: Not tested   Puree Puree: Within functional limits Presentation: Self Fed;Spoon(4 ozs)   Solid   GO   Solid: Within functional limits Presentation: Self Fed(5 trials) Other Comments: cut meats would be easiest for pt during self-feeding        Jerilynn SomKatherine Watson, MS, CCC-SLP Watson,Katherine 02/22/2018,12:29 PM

## 2018-02-22 NOTE — Progress Notes (Signed)
[employer] - [place] at [facility]   PATIENT NAME: Anthony BooksGeorge Thornton    MR#:  [id number]  DATE OF BIRTH:  01/06/1936  SUBJECTIVE:  CHIEF COMPLAINT:   Chief Complaint  Patient presents with  . Emesis  Patient remains confused and disoriented per baseline  REVIEW OF SYSTEMS:  CONSTITUTIONAL: No fever, fatigue or weakness.  EYES: No blurred or double vision.  EARS, NOSE, AND THROAT: No tinnitus or ear pain.  RESPIRATORY: No cough, shortness of breath, wheezing or hemoptysis.  CARDIOVASCULAR: No chest pain, orthopnea, edema.  GASTROINTESTINAL: No nausea, vomiting, diarrhea or abdominal pain.  GENITOURINARY: No dysuria, hematuria.  ENDOCRINE: No polyuria, nocturia,  HEMATOLOGY: No anemia, easy bruising or bleeding SKIN: No rash or lesion. MUSCULOSKELETAL: No joint pain or arthritis.   NEUROLOGIC: No tingling, numbness, weakness.  PSYCHIATRY: No anxiety or depression.   ROS  DRUG ALLERGIES:  No Known Allergies  VITALS:  Blood pressure 91/74, pulse 69, temperature 97.8 F (36.6 C), temperature source Oral, resp. rate 20, height 6' (1.829 m), weight 91.4 kg (201 lb 8 oz), SpO2 97 %.  PHYSICAL EXAMINATION:  GENERAL:  82 y.o.-year-old patient lying in the bed with no acute distress.  EYES: Pupils equal, round, reactive to light and accommodation. No scleral icterus. Extraocular muscles intact.  HEENT: Head atraumatic, normocephalic. Oropharynx and nasopharynx clear.  NECK:  Supple, no jugular venous distention. No thyroid enlargement, no tenderness.  LUNGS: Improved bilateral wheezing/rhonchi. No use of accessory muscles of respiration.  CARDIOVASCULAR: S1, S2 normal. No murmurs, rubs, or gallops.  ABDOMEN: Soft, nontender, nondistended. Bowel sounds present. No organomegaly or mass.  EXTREMITIES: No pedal edema, cyanosis, or clubbing.  NEUROLOGIC: Cranial nerves II through XII are intact. MAES. Gait not checked.  PSYCHIATRIC: The patient is awake, alert, confused  and disoriented per baseline   SKIN: No obvious rash, lesion, or ulcer.   Physical Exam LABORATORY PANEL:   CBC Recent Labs  Lab 02/21/18 1356  WBC 22.5*  HGB 13.5  HCT 39.9*  PLT 211   ------------------------------------------------------------------------------------------------------------------  Chemistries  Recent Labs  Lab 02/20/18 0849 02/21/18 1356  NA 136 127*  K 4.5 3.9  CL 101 97*  CO2 25 23  GLUCOSE 137* 121*  BUN 14 21*  CREATININE 0.99 0.97  CALCIUM 9.2 8.6*  AST 88*  --   ALT 94*  --   ALKPHOS 105  --   BILITOT 0.8  --    ------------------------------------------------------------------------------------------------------------------  Cardiac Enzymes Recent Labs  Lab 02/20/18 0849  TROPONINI <0.03   ------------------------------------------------------------------------------------------------------------------  RADIOLOGY:  No results found.  ASSESSMENT AND PLAN:  *Acute probable COPD exacerbation  Stable  Continue IV Solu-Medrol with tapering as tolerated, empiric cefepime/azithromycin, BTs prn, inhaled corticosteroids bid, f/u on sputum cultures, RT following, ST to see given dementia, and continue close medical monitoring  Influenza negative  *Acute lactic acidosis  Improving Most likely secondary to above  Continue IVFs for rehydration and repeat lactic acid   *Dementia, chronic Stable Continue increased nursing care as needed, aspiration/fall/skin care precautions while in house, ST to see  *Diabetes mellitus, chronic Stable on current regiment  Disposition back to assisted living facility in 1-2 days barring any complications   All the records are reviewed and case discussed with Care Management/Social Workerr. Management plans discussed with the patient, family and they are in agreement.  CODE STATUS: full TOTAL TIME TAKING CARE OF THIS PATIENT: 35 minutes.     POSSIBLE D/C IN 1-2 DAYS, DEPENDING ON CLINICAL  CONDITION.   Evelena Asa Salary M.D on 02/22/2018   Between 7am to 6pm - Pager - [phone]  After 6pm go to [email or web address] - password [email or web address]  [facility] [place] [facility]  Office  [phone]  CC: Primary care physician; Patient, No Pcp Per  Note: This dictation was prepared with Dragon dictation along with smaller phrase technology. Any transcriptional errors that result from this process are unintentional.

## 2018-02-23 LAB — LACTIC ACID, PLASMA
Lactic Acid, Venous: 2 mmol/L (ref 0.5–1.9)
Lactic Acid, Venous: 2.3 mmol/L (ref 0.5–1.9)

## 2018-02-23 LAB — CBC WITH DIFFERENTIAL/PLATELET
Basophils Absolute: 0 10*3/uL (ref 0–0.1)
Basophils Relative: 0 %
Eosinophils Absolute: 0 10*3/uL (ref 0–0.7)
Eosinophils Relative: 0 %
HCT: 36 % — ABNORMAL LOW (ref 40.0–52.0)
Hemoglobin: 12.4 g/dL — ABNORMAL LOW (ref 13.0–18.0)
LYMPHS ABS: 1.1 10*3/uL (ref 1.0–3.6)
Lymphocytes Relative: 7 %
MCH: 30.1 pg (ref 26.0–34.0)
MCHC: 34.3 g/dL (ref 32.0–36.0)
MCV: 87.6 fL (ref 80.0–100.0)
MONO ABS: 0.7 10*3/uL (ref 0.2–1.0)
MONOS PCT: 4 %
Neutro Abs: 14.2 10*3/uL — ABNORMAL HIGH (ref 1.4–6.5)
Neutrophils Relative %: 89 %
Platelets: 211 10*3/uL (ref 150–440)
RBC: 4.12 MIL/uL — ABNORMAL LOW (ref 4.40–5.90)
RDW: 14.7 % — AB (ref 11.5–14.5)
WBC: 16 10*3/uL — ABNORMAL HIGH (ref 3.8–10.6)

## 2018-02-23 LAB — GLUCOSE, CAPILLARY
GLUCOSE-CAPILLARY: 148 mg/dL — AB (ref 65–99)
Glucose-Capillary: 120 mg/dL — ABNORMAL HIGH (ref 65–99)

## 2018-02-23 MED ORDER — PREDNISONE 50 MG PO TABS: ORAL_TABLET | ORAL | 0 refills | Status: DC

## 2018-02-23 MED ORDER — CEFDINIR 300 MG PO CAPS: 300.0000 mg | ORAL_CAPSULE | Freq: Two times a day (BID) | ORAL | 0 refills | Status: DC

## 2018-02-23 MED ORDER — ALBUTEROL SULFATE (2.5 MG/3ML) 0.083% IN NEBU: 2.5000 mg | INHALATION_SOLUTION | Freq: Two times a day (BID) | RESPIRATORY_TRACT | 0 refills | Status: DC

## 2018-02-23 MED ORDER — AZITHROMYCIN 250 MG PO TABS: ORAL_TABLET | ORAL | 0 refills | Status: DC

## 2018-02-23 MED ORDER — GUAIFENESIN ER 600 MG PO TB12: 600.0000 mg | ORAL_TABLET | Freq: Two times a day (BID) | ORAL | 0 refills | Status: DC

## 2018-02-23 MED ORDER — LORAZEPAM 1 MG PO TABS: 0.5000 mg | ORAL_TABLET | Freq: Every day | ORAL | 0 refills | Status: DC | PRN

## 2018-02-23 NOTE — Discharge Summary (Signed)
[facility] [place] at [facility]   PATIENT NAME: Anthony Thornton    MR#:  [id number]  DATE OF BIRTH:  Jul 02, 1936  DATE OF ADMISSION:  02/20/2018 ADMITTING PHYSICIAN: Milagros Loll, MD  DATE OF DISCHARGE: No discharge date for patient encounter.  PRIMARY CARE PHYSICIAN: Patient, No Pcp Per    ADMISSION DIAGNOSIS:  vomiting  DISCHARGE DIAGNOSIS:  Active Problems:   Acute bronchitis   SECONDARY DIAGNOSIS:   Past Medical History:  Diagnosis Date  . Adjustment disorder with anxiety   . CVA (cerebral vascular accident) (HCC)   . Dementia   . Diabetes mellitus without complication (HCC)   . Glaucoma   . Hypercholesteremia   . Hypertension   . Pacemaker   . PE (pulmonary thromboembolism) [facility])     HOSPITAL COURSE:  *Acute probable COPD exacerbation  Resolving Treated with IV Solu-Medrol with tapering as tolerated, empiric cefepime/azithromycin,  aggressive pulmonary toilet with bronchodilator therapy, inhaled corticosteroids bid, RT did see patient while in house, ST did see patient while in house-no change in consistency recommended, and patient did well  Influenza negative  *Acute lactic acidosis  Resolved with IV fluids for rehydration Most likely secondary to above   *Dementia, chronic Stable Provided increased nursing care as needed, aspiration/fall/skin care precautions while in house, ST per above   *Diabetes mellitus, chronic Stable on current regiment  DISCHARGE CONDITIONS:  On day of discharge patient is afebrile, hemodynamically stable, tolerating diet, ready for discharge home, for more specific details please see chart   CONSULTS OBTAINED:    DRUG ALLERGIES:  No Known Allergies  DISCHARGE MEDICATIONS:   Allergies as of 02/23/2018   No Known Allergies     Medication List    TAKE these medications   albuterol 108 (90 Base) MCG/ACT inhaler Commonly known as:  PROVENTIL HFA;VENTOLIN HFA Inhale 2 puffs into the  lungs every 6 (six) hours as needed for wheezing. What changed:  Another medication with the same name was added. Make sure you understand how and when to take each.   albuterol (2.5 MG/3ML) 0.083% nebulizer solution Commonly known as:  PROVENTIL Take 3 mLs (2.5 mg total) by nebulization 2 (two) times daily for 10 days. What changed:  You were already taking a medication with the same name, and this prescription was added. Make sure you understand how and when to take each.   aspirin 81 MG chewable tablet Chew 81 mg by mouth daily.   atorvastatin 80 MG tablet Commonly known as:  LIPITOR Take 80 mg by mouth at bedtime.   azithromycin 250 MG tablet Commonly known as:  ZITHROMAX 1 p.o. daily   carvedilol 3.125 MG tablet Commonly known as:  COREG Take 1.5625 mg by mouth 2 (two) times daily with a meal.   cefdinir 300 MG capsule Commonly known as:  OMNICEF Take 1 capsule (300 mg total) by mouth 2 (two) times daily.   clopidogrel 75 MG tablet Commonly known as:  PLAVIX Take 75 mg by mouth at bedtime.   divalproex 250 MG DR tablet Commonly known as:  DEPAKOTE Take 250-500 mg by mouth 2 (two) times daily. Take 250 mg by mouth in the morning and 500 mg by mouth at bedtime.   donepezil 10 MG tablet Commonly known as:  ARICEPT Take 10 mg by mouth 2 (two) times daily.   furosemide 20 MG tablet Commonly known as:  LASIX Take 20 mg by mouth daily.   guaiFENesin 600 MG 12 hr tablet Commonly  known as:  MUCINEX Take 1 tablet (600 mg total) by mouth 2 (two) times daily.   lisinopril 5 MG tablet Commonly known as:  PRINIVIL,ZESTRIL Take 5 mg by mouth daily.   LORazepam 1 MG tablet Commonly known as:  ATIVAN Take 0.5 tablets (0.5 mg total) by mouth daily as needed (agitation).   meclizine 12.5 MG tablet Commonly known as:  ANTIVERT Take 12.5 mg by mouth 2 (two) times daily as needed for nausea.   OLANZapine 2.5 MG tablet Commonly known as:  ZYPREXA Take 2.5 mg by mouth  daily.   predniSONE 50 MG tablet Commonly known as:  DELTASONE 1 p.o. daily   traZODone 50 MG tablet Commonly known as:  DESYREL Take 50 mg by mouth at bedtime.        DISCHARGE INSTRUCTIONS:  If you experience worsening of your admission symptoms, develop shortness of breath, life threatening emergency, suicidal or homicidal thoughts you must seek medical attention immediately by calling 911 or calling your MD immediately  if symptoms less severe.  You Must read complete instructions/literature along with all the possible adverse reactions/side effects for all the Medicines you take and that have been prescribed to you. Take any new Medicines after you have completely understood and accept all the possible adverse reactions/side effects.   Please note  You were cared for by a hospitalist during your hospital stay. If you have any questions about your discharge medications or the care you received while you were in the hospital after you are discharged, you can call the unit and asked to speak with the hospitalist on call if the hospitalist that took care of you is not available. Once you are discharged, your primary care physician will handle any further medical issues. Please note that NO REFILLS for any discharge medications will be authorized once you are discharged, as it is imperative that you return to your primary care physician (or establish a relationship with a primary care physician if you do not have one) for your aftercare needs so that they can reassess your need for medications and monitor your lab values.    Today   CHIEF COMPLAINT:   Chief Complaint  Patient presents with  . Emesis    HISTORY OF PRESENT ILLNESS:  82 y.o. male with a known history of dementia, hypertension, diabetes mellitus presents from nursing home due to one episode of vomiting.  Patient has also had wheezing and some shortness of breath.  Here in the emergency room he has been found to have  bilateral significant wheezing with no improvement with nebulizers.  Also lactic acid 4.1.  Chest x-ray clear. Patient is poor historian.  He mentions he is here because he fell.  But no history of falling at the nursing home.  He does not remember that he had one episode of vomiting. Patient given 1 dose of vancomycin, cefepime and IV fluids in the ER.  VITAL SIGNS:  Blood pressure (!) 115/56, pulse (!) 46, temperature 98 F (36.7 C), temperature source Oral, resp. rate 18, height 6' (1.829 m), weight 93.4 kg (205 lb 14.6 oz), SpO2 95 %.  I/O:    Intake/Output Summary (Last 24 hours) at 02/23/2018 1130 Last data filed at 02/23/2018 1036 Gross per 24 hour  Intake 2597 ml  Output 850 ml  Net 1747 ml    PHYSICAL EXAMINATION:  GENERAL:  82 y.o.-year-old patient lying in the bed with no acute distress.  EYES: Pupils equal, round, reactive to light and accommodation.  No scleral icterus. Extraocular muscles intact.  HEENT: Head atraumatic, normocephalic. Oropharynx and nasopharynx clear.  NECK:  Supple, no jugular venous distention. No thyroid enlargement, no tenderness.  LUNGS: Normal breath sounds bilaterally, no wheezing, rales,rhonchi or crepitation. No use of accessory muscles of respiration.  CARDIOVASCULAR: S1, S2 normal. No murmurs, rubs, or gallops.  ABDOMEN: Soft, non-tender, non-distended. Bowel sounds present. No organomegaly or mass.  EXTREMITIES: No pedal edema, cyanosis, or clubbing.  NEUROLOGIC: Cranial nerves II through XII are intact. Muscle strength 5/5 in all extremities. Sensation intact. Gait not checked.  PSYCHIATRIC: The patient is alert and oriented x 3.  SKIN: No obvious rash, lesion, or ulcer.   DATA REVIEW:   CBC Recent Labs  Lab 02/23/18 0821  WBC 16.0*  HGB 12.4*  HCT 36.0*  PLT 211    Chemistries  Recent Labs  Lab 02/20/18 0849 02/21/18 1356  NA 136 127*  K 4.5 3.9  CL 101 97*  CO2 25 23  GLUCOSE 137* 121*  BUN 14 21*  CREATININE 0.99  0.97  CALCIUM 9.2 8.6*  AST 88*  --   ALT 94*  --   ALKPHOS 105  --   BILITOT 0.8  --     Cardiac Enzymes Recent Labs  Lab 02/20/18 0849  TROPONINI <0.03    Microbiology Results  Results for orders placed or performed during the hospital encounter of 02/20/18  Blood Culture (routine x 2)     Status: None (Preliminary result)   Collection Time: 02/20/18  9:35 AM  Result Value Ref Range Status   Specimen Description BLOOD LEFT FOREARM  Final   Special Requests BOTTLES DRAWN AEROBIC AND ANAEROBIC BCAV  Final   Culture   Final    NO GROWTH 3 DAYS Performed at [facility], [address], [place], [place] [address]    Report Status PENDING  Incomplete  Blood Culture (routine x 2)     Status: None (Preliminary result)   Collection Time: 02/20/18  9:35 AM  Result Value Ref Range Status   Specimen Description BLOOD RIGHT HAND  Final   Special Requests BOTTLES DRAWN AEROBIC AND ANAEROBIC BCAV  Final   Culture   Final    NO GROWTH 3 DAYS Performed at [facility], [address], [place], [place] [address]    Report Status PENDING  Incomplete  MRSA PCR Screening     Status: None   Collection Time: 02/20/18  3:13 PM  Result Value Ref Range Status   MRSA by PCR NEGATIVE NEGATIVE Final    Comment:        The GeneXpert MRSA Assay (FDA approved for NASAL specimens only), is one component of a comprehensive MRSA colonization surveillance program. It is not intended to diagnose MRSA infection nor to guide or monitor treatment for MRSA infections. Performed at [facility], [address], [place], [place] [address]     RADIOLOGY:  No results found.  EKG:   Orders placed or performed during the hospital encounter of 02/20/18  . ED EKG 12-Lead  . ED EKG 12-Lead  . EKG 12-Lead  . EKG 12-Lead      Management plans discussed with the patient, family and they are in agreement.  CODE STATUS:     Code Status Orders  (From admission,  onward)        Start     Ordered   02/20/18 1146  Full code  Continuous     02/20/18 1145  Code Status History    This patient has a current code status but no historical code status.      TOTAL TIME TAKING CARE OF THIS PATIENT: 45 minutes.    Evelena AsaMontell D Kyrstin Campillo M.D on 02/23/2018 at 11:30 AM  Between 7am to 6pm - Pager - [phone]  After 6pm go to [email or web address] - password [email or web address]  [facility]  Office  [phone]  CC: Primary care physician; Patient, No Pcp Per   Note: This dictation was prepared with Dragon dictation along with smaller phrase technology. Any transcriptional errors that result from this process are unintentional.

## 2018-02-23 NOTE — Progress Notes (Signed)
Notified Dr. Sheryle Hailiamond of heart rate 46 and trend of being brady. He ordered to not give the morning Atenolol. Will hold and continue to monitor patient.

## 2018-02-23 NOTE — NC FL2 (Signed)
[place] MEDICAID FL2 LEVEL OF CARE SCREENING TOOL     IDENTIFICATION  Patient Name: Anthony BlowGeorge W Reagor Birthdate: 12/23/1935 Sex: male Admission Date (Current Location): 02/20/2018  [place]   Facility and Address:  [facility], [address], [place], [place] [address]      Provider Number: [id number]  Attending Physician Name and Address:  Bertrum SolSalary, Montell D, MD  Relative Name and Phone Number:       Current Level of Care: Hospital Recommended Level of Care: Memory Care, Assisted Living Facility Prior Approval Number:    Date Approved/Denied:   PASRR Number:    Discharge Plan: (memory care ALF)    Current Diagnoses:vascular dementia Patient Active Problem List   Diagnosis Date Noted  . Acute bronchitis 02/20/2018  . 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 Vascular dementia 08/23/2017    Orientation RESPIRATION BLADDER Height & Weight     Self  Normal Incontinent Weight: 205 lb 14.6 oz (93.4 kg) Height:  6' (182.9 cm)  BEHAVIORAL SYMPTOMS/MOOD NEUROLOGICAL BOWEL NUTRITION STATUS  (none) (none) Incontinent Diet(carb modified)  AMBULATORY STATUS COMMUNICATION OF NEEDS Skin   Limited Assist Verbally Normal                       Personal Care Assistance Level of Assistance  Bathing, Feeding, Dressing Bathing Assistance: Limited assistance Feeding assistance: Limited assistance Dressing Assistance: Limited assistance     Functional Limitations Info             SPECIAL CARE FACTORS FREQUENCY                       Contractures Contractures Info: Not present    Additional Factors Info  Code Status Code Status Info: full             Medication List    TAKE these medications   albuterol 108 (90 Base) MCG/ACT inhaler Commonly known as:  PROVENTIL HFA;VENTOLIN HFA Inhale 2 puffs into the lungs every 6 (six) hours as needed for wheezing. What changed:   Another medication with the same name was added. Make sure you understand how and when to take each.   albuterol (2.5 MG/3ML) 0.083% nebulizer solution Commonly known as:  PROVENTIL Take 3 mLs (2.5 mg total) by nebulization 2 (two) times daily for 10 days. What changed:  You were already taking a medication with the same name, and this prescription was added. Make sure you understand how and when to take each.   aspirin 81 MG chewable tablet Chew 81 mg by mouth daily.   atorvastatin 80 MG tablet Commonly known as:  LIPITOR Take 80 mg by mouth at bedtime.   azithromycin 250 MG tablet Commonly known as:  ZITHROMAX 1 p.o. daily   carvedilol 3.125 MG tablet Commonly known as:  COREG Take 1.5625 mg by mouth 2 (two) times daily with a meal.   cefdinir 300 MG capsule Commonly known as:  OMNICEF Take 1 capsule (300 mg total) by mouth 2 (two) times daily.   clopidogrel 75 MG tablet Commonly known as:  PLAVIX Take 75 mg by mouth at bedtime.   divalproex 250 MG DR tablet Commonly known as:  DEPAKOTE Take 250-500 mg by mouth 2 (two) times daily. Take 250 mg by mouth in the morning and 500 mg by mouth at bedtime.   donepezil 10 MG tablet Commonly known as:  ARICEPT Take  10 mg by mouth 2 (two) times daily.   furosemide 20 MG tablet Commonly known as:  LASIX Take 20 mg by mouth daily.   guaiFENesin 600 MG 12 hr tablet Commonly known as:  MUCINEX Take 1 tablet (600 mg total) by mouth 2 (two) times daily.   lisinopril 5 MG tablet Commonly known as:  PRINIVIL,ZESTRIL Take 5 mg by mouth daily.   LORazepam 1 MG tablet Commonly known as:  ATIVAN Take 0.5 tablets (0.5 mg total) by mouth daily as needed (agitation).   meclizine 12.5 MG tablet Commonly known as:  ANTIVERT Take 12.5 mg by mouth 2 (two) times daily as needed for nausea.   OLANZapine 2.5 MG tablet Commonly known as:  ZYPREXA Take 2.5 mg by mouth daily.   predniSONE 50 MG tablet Commonly known as:   DELTASONE 1 p.o. daily   traZODone 50 MG tablet Commonly known as:  DESYREL Take 50 mg by mouth at bedtime.           [place] Spaniel, [place]

## 2018-02-23 NOTE — Progress Notes (Signed)
Anthony BlowGeorge W Thornton to be D/C'd  Via EMS to [facility] per MD order.  Discussed prescriptions and follow up appointments with the patient. Prescriptions given to patient, medication list explained in detail. Pt verbalized understanding.  Allergies as of 02/23/2018   No Known Allergies     Medication List    TAKE these medications   albuterol 108 (90 Base) MCG/ACT inhaler Commonly known as:  PROVENTIL HFA;VENTOLIN HFA Inhale 2 puffs into the lungs every 6 (six) hours as needed for wheezing. What changed:  Another medication with the same name was added. Make sure you understand how and when to take each.   albuterol (2.5 MG/3ML) 0.083% nebulizer solution Commonly known as:  PROVENTIL Take 3 mLs (2.5 mg total) by nebulization 2 (two) times daily for 10 days. What changed:  You were already taking a medication with the same name, and this prescription was added. Make sure you understand how and when to take each.   aspirin 81 MG chewable tablet Chew 81 mg by mouth daily.   atorvastatin 80 MG tablet Commonly known as:  LIPITOR Take 80 mg by mouth at bedtime.   azithromycin 250 MG tablet Commonly known as:  ZITHROMAX 1 p.o. daily   carvedilol 3.125 MG tablet Commonly known as:  COREG Take 1.5625 mg by mouth 2 (two) times daily with a meal.   cefdinir 300 MG capsule Commonly known as:  OMNICEF Take 1 capsule (300 mg total) by mouth 2 (two) times daily.   clopidogrel 75 MG tablet Commonly known as:  PLAVIX Take 75 mg by mouth at bedtime.   divalproex 250 MG DR tablet Commonly known as:  DEPAKOTE Take 250-500 mg by mouth 2 (two) times daily. Take 250 mg by mouth in the morning and 500 mg by mouth at bedtime.   donepezil 10 MG tablet Commonly known as:  ARICEPT Take 10 mg by mouth 2 (two) times daily.   furosemide 20 MG tablet Commonly known as:  LASIX Take 20 mg by mouth daily.   guaiFENesin 600 MG 12 hr tablet Commonly known as:  MUCINEX Take 1 tablet (600 mg total)  by mouth 2 (two) times daily.   lisinopril 5 MG tablet Commonly known as:  PRINIVIL,ZESTRIL Take 5 mg by mouth daily.   LORazepam 1 MG tablet Commonly known as:  ATIVAN Take 0.5 tablets (0.5 mg total) by mouth daily as needed (agitation).   meclizine 12.5 MG tablet Commonly known as:  ANTIVERT Take 12.5 mg by mouth 2 (two) times daily as needed for nausea.   OLANZapine 2.5 MG tablet Commonly known as:  ZYPREXA Take 2.5 mg by mouth daily.   predniSONE 50 MG tablet Commonly known as:  DELTASONE 1 p.o. daily   traZODone 50 MG tablet Commonly known as:  DESYREL Take 50 mg by mouth at bedtime.       Vitals:   02/23/18 0559 02/23/18 0753  BP: (!) 115/56   Pulse: (!) 46   Resp:    Temp:    SpO2:  95%    Skin clean, dry and intact without evidence of skin break down, no evidence of skin tears noted. IV catheter discontinued intact. Site without signs and symptoms of complications. Dressing and pressure applied. Pt denies pain at this time. No complaints noted.  Report called to [facility] care unit. EMS called for transport. Spouse made aware of transfer back to facility.   [name]

## 2018-02-23 NOTE — Care Management Important Message (Signed)
Copy of signed IM left in patient's room.    

## 2018-02-23 NOTE — Clinical Social Work Note (Signed)
Patient discharging to return to [facility] today. Doug with [facility] states patient can return. Patient's wife is aware and discharge information sent. Patient to transport via EMS. York SpanielMonica Honest Safranek MSW,LCSW [phone]

## 2018-02-25 LAB — CULTURE, BLOOD (ROUTINE X 2)
Culture: NO GROWTH
Culture: NO GROWTH

## 2018-03-12 ENCOUNTER — Encounter: Payer: Self-pay | Admitting: Emergency Medicine

## 2018-03-12 ENCOUNTER — Emergency Department: Payer: Medicare Other

## 2018-03-12 ENCOUNTER — Emergency Department
Admission: EM | Admit: 2018-03-12 | Discharge: 2018-03-12 | Disposition: A | Discharge status: Skilled Nursing Facility | Payer: Medicare Other | Attending: Emergency Medicine | Admitting: Emergency Medicine

## 2018-03-12 DIAGNOSIS — Y999 Unspecified external cause status: Secondary | ICD-10-CM | POA: Diagnosis not present

## 2018-03-12 DIAGNOSIS — Z79899 Other long term (current) drug therapy: Secondary | ICD-10-CM | POA: Insufficient documentation

## 2018-03-12 DIAGNOSIS — S0181XA Laceration without foreign body of other part of head, initial encounter: Secondary | ICD-10-CM | POA: Diagnosis not present

## 2018-03-12 DIAGNOSIS — Y939 Activity, unspecified: Secondary | ICD-10-CM | POA: Diagnosis not present

## 2018-03-12 DIAGNOSIS — I10 Essential (primary) hypertension: Secondary | ICD-10-CM | POA: Insufficient documentation

## 2018-03-12 DIAGNOSIS — Z87891 Personal history of nicotine dependence: Secondary | ICD-10-CM | POA: Diagnosis not present

## 2018-03-12 DIAGNOSIS — Z7982 Long term (current) use of aspirin: Secondary | ICD-10-CM | POA: Insufficient documentation

## 2018-03-12 DIAGNOSIS — Z7901 Long term (current) use of anticoagulants: Secondary | ICD-10-CM | POA: Diagnosis not present

## 2018-03-12 DIAGNOSIS — W19XXXA Unspecified fall, initial encounter: Secondary | ICD-10-CM | POA: Insufficient documentation

## 2018-03-12 DIAGNOSIS — E119 Type 2 diabetes mellitus without complications: Secondary | ICD-10-CM | POA: Diagnosis not present

## 2018-03-12 DIAGNOSIS — F0390 Unspecified dementia without behavioral disturbance: Secondary | ICD-10-CM | POA: Diagnosis not present

## 2018-03-12 DIAGNOSIS — Y929 Unspecified place or not applicable: Secondary | ICD-10-CM | POA: Diagnosis not present

## 2018-03-12 DIAGNOSIS — S0990XA Unspecified injury of head, initial encounter: Secondary | ICD-10-CM | POA: Diagnosis present

## 2018-03-12 LAB — COMPREHENSIVE METABOLIC PANEL
ALBUMIN: 3.1 g/dL — AB (ref 3.5–5.0)
ALK PHOS: 60 U/L (ref 38–126)
ALT: 35 U/L (ref 17–63)
AST: 39 U/L (ref 15–41)
Anion gap: 8 (ref 5–15)
BUN: 9 mg/dL (ref 6–20)
CO2: 25 mmol/L (ref 22–32)
CREATININE: 0.94 mg/dL (ref 0.61–1.24)
Calcium: 8.4 mg/dL — ABNORMAL LOW (ref 8.9–10.3)
Chloride: 104 mmol/L (ref 101–111)
GFR calc Af Amer: >60 mL/min (ref >60)
GFR calc non Af Amer: >60 mL/min (ref >60)
GLUCOSE: 82 mg/dL (ref 65–99)
Potassium: 4.3 mmol/L (ref 3.5–5.1)
SODIUM: 137 mmol/L (ref 135–145)
Total Bilirubin: 0.7 mg/dL (ref 0.3–1.2)
Total Protein: 6.1 g/dL — ABNORMAL LOW (ref 6.5–8.1)

## 2018-03-12 LAB — CBC WITH DIFFERENTIAL/PLATELET
Basophils Absolute: 0.1 10*3/uL (ref 0–0.1)
Basophils Relative: 1 %
Eosinophils Absolute: 0 10*3/uL (ref 0–0.7)
Eosinophils Relative: 1 %
HCT: 39.6 % — ABNORMAL LOW (ref 40.0–52.0)
HEMOGLOBIN: 13.3 g/dL (ref 13.0–18.0)
LYMPHS ABS: 1.9 10*3/uL (ref 1.0–3.6)
LYMPHS PCT: 39 %
MCH: 30.2 pg (ref 26.0–34.0)
MCHC: 33.6 g/dL (ref 32.0–36.0)
MCV: 90.1 fL (ref 80.0–100.0)
Monocytes Absolute: 0.7 10*3/uL (ref 0.2–1.0)
Monocytes Relative: 15 %
NEUTROS ABS: 2.1 10*3/uL (ref 1.4–6.5)
NEUTROS PCT: 44 %
Platelets: 123 10*3/uL — ABNORMAL LOW (ref 150–440)
RBC: 4.4 MIL/uL (ref 4.40–5.90)
RDW: 15.1 % — ABNORMAL HIGH (ref 11.5–14.5)
WBC: 4.8 10*3/uL (ref 3.8–10.6)

## 2018-03-12 LAB — TROPONIN I: Troponin I: <0.03 ng/mL (ref <0.03)

## 2018-03-12 NOTE — ED Triage Notes (Signed)
Pt arrived from [facility] after unwitnessed fall resulting in a laceration to the left side of his face. Area is wrapped with gauze and bleeding appears under control. Pt is a poor historian and events leading up to fall are unknown.

## 2018-03-12 NOTE — ED Provider Notes (Signed)
[facility] Emergency Department Provider Note   ____________________________________________    I have reviewed the triage vital signs and the nursing notes.   HISTORY  Chief Complaint Fall  History of dementia, unable to provide significant history  HPI Anthony Thornton is a 82 y.o. male who presents after an unwitnessed fall.  Patient has a history of dementia and does not remember the fall.  EMS reports that he has baseline left-sided weakness.  Patient has old small laceration to the left side of his head.  He denies extremity injury.  No abdominal pain.  No chest pain.  No back pain.  No neck pain  Past Medical History:  Diagnosis Date  . Adjustment disorder with anxiety   . CVA (cerebral vascular accident) (HCC)   . Dementia   . Diabetes mellitus without complication (HCC)   . Glaucoma   . Hypercholesteremia   . Hypertension   . Pacemaker   . PE (pulmonary thromboembolism) [facility])     Patient Active Problem List   Diagnosis Date Noted  . Acute bronchitis 02/20/2018  . 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 Vascular dementia 08/23/2017    Past Surgical History:  Procedure Laterality Date  . AORTIC VALVE REPLACEMENT (AVR)/CORONARY ARTERY BYPASS GRAFTING (CABG)      Prior to Admission medications   Medication Sig Start Date End Date Taking? Authorizing Provider  albuterol (PROVENTIL HFA;VENTOLIN HFA) 108 (90 Base) MCG/ACT inhaler Inhale 2 puffs into the lungs every 6 (six) hours as needed for wheezing.    [provider]  albuterol (PROVENTIL) (2.5 MG/3ML) 0.083% nebulizer solution Take 3 mLs (2.5 mg total) by nebulization 2 (two) times daily for 10 days. 02/23/18 03/05/18  Salary, Jetty Duhamel D, MD  aspirin 81 MG chewable tablet Chew 81 mg by mouth daily.    [provider]  atorvastatin (LIPITOR) 80 MG tablet Take 80 mg by mouth at bedtime.     [provider]  azithromycin  (ZITHROMAX) 250 MG tablet 1 p.o. daily 02/23/18   Salary, Evelena Asa, MD  carvedilol (COREG) 3.125 MG tablet Take 1.5625 mg by mouth 2 (two) times daily with a meal.     [provider]  cefdinir (OMNICEF) 300 MG capsule Take 1 capsule (300 mg total) by mouth 2 (two) times daily. 02/23/18   Salary, Jetty Duhamel D, MD  clopidogrel (PLAVIX) 75 MG tablet Take 75 mg by mouth at bedtime.     [provider]  divalproex (DEPAKOTE) 250 MG DR tablet Take 250-500 mg by mouth 2 (two) times daily. Take 250 mg by mouth in the morning and 500 mg by mouth at bedtime.    [provider]  donepezil (ARICEPT) 10 MG tablet Take 10 mg by mouth 2 (two) times daily.     [provider]  furosemide (LASIX) 20 MG tablet Take 20 mg by mouth daily. 10/28/16   [provider]  guaiFENesin (MUCINEX) 600 MG 12 hr tablet Take 1 tablet (600 mg total) by mouth 2 (two) times daily. 02/23/18 02/23/19  Salary, Evelena Asa, MD  lisinopril (PRINIVIL,ZESTRIL) 5 MG tablet Take 5 mg by mouth daily. 11/29/16   [provider]  LORazepam (ATIVAN) 1 MG tablet Take 0.5 tablets (0.5 mg total) by mouth daily as needed (agitation). 02/23/18   Salary, Evelena Asa, MD  meclizine (ANTIVERT) 12.5 MG tablet Take 12.5 mg by mouth 2 (two) times daily as needed for nausea.  [provider]  OLANZapine (ZYPREXA) 2.5 MG tablet Take 2.5 mg by mouth daily.    [provider]  predniSONE (DELTASONE) 50 MG tablet 1 p.o. daily 02/23/18   Salary, Jetty Duhamel D, MD  traZODone (DESYREL) 50 MG tablet Take 50 mg by mouth at bedtime.    [provider]     Allergies Patient has no known allergies.  No family history on file.  Social History Social History   Tobacco Use  . Smoking status: Former Games developer  . Smokeless tobacco: Never Used  Substance Use Topics  . Alcohol use: No  . Drug use: No    Review of Systems  Constitutional: Denies dizziness Eyes: No visual changes.  ENT: Denies  neck pain Cardiovascular: Denies chest pain. Respiratory: Denies shortness of breath. Gastrointestinal: No abdominal pain.     Genitourinary: No groin injury Musculoskeletal: Negative for back pain. Skin: Cut to the head Neurological: Negative for headaches   ____________________________________________   PHYSICAL EXAM:  VITAL SIGNS: ED Triage Vitals [03/12/18 0841]  Enc Vitals Group     BP (!) 122/57     Pulse Rate (!) 52     Resp      Temp 98.2 F (36.8 C)     Temp Source Oral     SpO2 98 %     Weight 93 kg (205 lb)     Height 1.829 m (6')     Head Circumference      Peak Flow      Pain Score      Pain Loc      Pain Edu?      Excl. in GC?     Constitutional: Alert.. No acute distress. Pleasant and interactive Eyes: Conjunctivae are normal.  Head: 2 cm laceration left lateral forehead adjacent to the orbit Nose: No nasal swelling or epistaxis Mouth/Throat: Mucous membranes are moist.   Neck:  Painless ROM, no vertebral tenderness to palpation Cardiovascular: Normal rate, regular rhythm.   Good peripheral circulation. Respiratory: Normal respiratory effort.  No retractions. Lungs CTAB. Gastrointestinal: Soft and nontender. No distention.   Genitourinary: deferred Musculoskeletal: No lower extremity tenderness nor edema.  Warm and well perfused, normal range of motion Neurologic:  Normal speech and language. No gross focal neurologic deficits are appreciated.  Skin:  Skin is warm, dry and intact. No rash noted. Psychiatric: Mood and affect are normal. Speech and behavior are normal.  ____________________________________________   LABS (all labs ordered are listed, but only abnormal results are displayed)  Labs Reviewed  CBC WITH DIFFERENTIAL/PLATELET - Abnormal; Notable for the following components:      Result Value   HCT 39.6 (*)    RDW 15.1 (*)    Platelets 123 (*)    All other components within normal limits  COMPREHENSIVE METABOLIC PANEL - Abnormal;  Notable for the following components:   Calcium 8.4 (*)    Total Protein 6.1 (*)    Albumin 3.1 (*)    All other components within normal limits  TROPONIN I   ____________________________________________  EKG  ED ECG REPORT I, Jene Every, the attending physician, personally viewed and interpreted this ECG.  Date: 03/12/2018  Rhythm: normal sinus rhythm QRS Axis: normal Intervals: Right bundle branch block ST/T Wave abnormalities: T wave inversions inferiorly and laterally   ____________________________________________  RADIOLOGY  CT head ____________________________________________   PROCEDURES  Procedure(s) performed: yes  .Marland KitchenLaceration Repair Date/Time: 03/12/2018 10:00 AM Performed by: Jene Every, MD Authorized by: Jene Every, MD  Consent:    Consent obtained:  Verbal   Consent given by:  Patient   Risks discussed:  Infection and pain Anesthesia (see MAR for exact dosages):    Anesthesia method:  None Laceration details:    Location:  Face   Length (cm):  2 Repair type:    Repair type:  Simple Exploration:    Contaminated: no   Treatment:    Irrigation solution:  Sterile saline   Visualized foreign bodies/material removed: no   Skin repair:    Repair method:  Tissue adhesive Approximation:    Approximation:  Close Post-procedure details:    Dressing:  Sterile dressing   Patient tolerance of procedure:  Tolerated well, no immediate complications     Critical Care performed:No ____________________________________________   INITIAL IMPRESSION / ASSESSMENT AND PLAN / ED COURSE  Pertinent labs & imaging results that were available during my care of the patient were reviewed by me and considered in my medical decision making (see chart for details).  Patient presents after unwitnessed fall.  Small laceration to the left lateral orbit, repaired  CT head does not demonstrate any acute abnormalities.  Labs including troponin normal.   Appropriate for discharge at this time    ____________________________________________   FINAL CLINICAL IMPRESSION(S) / ED DIAGNOSES  Final diagnoses:  Fall, initial encounter  Facial laceration, initial encounter        Note:  This document was prepared using Dragon voice recognition software and may include unintentional dictation errors.    Jene Every, MD 03/12/18 1249

## 2018-03-12 NOTE — Discharge Instructions (Addendum)
Patient had reassuring workup, CT scan normal. Small laceration repaired with skin glue

## 2018-03-23 ENCOUNTER — Other Ambulatory Visit: Payer: Self-pay

## 2018-03-23 ENCOUNTER — Emergency Department
Admission: EM | Admit: 2018-03-23 | Discharge: 2018-03-23 | Disposition: A | Discharge status: Home / Self Care | Payer: Medicare Other | Attending: Emergency Medicine | Admitting: Emergency Medicine

## 2018-03-23 DIAGNOSIS — I10 Essential (primary) hypertension: Secondary | ICD-10-CM | POA: Insufficient documentation

## 2018-03-23 DIAGNOSIS — Z87891 Personal history of nicotine dependence: Secondary | ICD-10-CM | POA: Insufficient documentation

## 2018-03-23 DIAGNOSIS — Z951 Presence of aortocoronary bypass graft: Secondary | ICD-10-CM | POA: Diagnosis not present

## 2018-03-23 DIAGNOSIS — Z7902 Long term (current) use of antithrombotics/antiplatelets: Secondary | ICD-10-CM | POA: Diagnosis not present

## 2018-03-23 DIAGNOSIS — F0390 Unspecified dementia without behavioral disturbance: Secondary | ICD-10-CM | POA: Insufficient documentation

## 2018-03-23 DIAGNOSIS — Z7982 Long term (current) use of aspirin: Secondary | ICD-10-CM | POA: Diagnosis not present

## 2018-03-23 DIAGNOSIS — Z79899 Other long term (current) drug therapy: Secondary | ICD-10-CM | POA: Diagnosis not present

## 2018-03-23 DIAGNOSIS — Z8673 Personal history of transient ischemic attack (TIA), and cerebral infarction without residual deficits: Secondary | ICD-10-CM | POA: Insufficient documentation

## 2018-03-23 DIAGNOSIS — R42 Dizziness and giddiness: Secondary | ICD-10-CM | POA: Insufficient documentation

## 2018-03-23 DIAGNOSIS — Z950 Presence of cardiac pacemaker: Secondary | ICD-10-CM | POA: Insufficient documentation

## 2018-03-23 DIAGNOSIS — E119 Type 2 diabetes mellitus without complications: Secondary | ICD-10-CM | POA: Diagnosis not present

## 2018-03-23 DIAGNOSIS — R112 Nausea with vomiting, unspecified: Secondary | ICD-10-CM | POA: Diagnosis not present

## 2018-03-23 LAB — COMPREHENSIVE METABOLIC PANEL
ALT: 42 U/L (ref 17–63)
ANION GAP: 9 (ref 5–15)
AST: 47 U/L — ABNORMAL HIGH (ref 15–41)
Albumin: 3.5 g/dL (ref 3.5–5.0)
Alkaline Phosphatase: 79 U/L (ref 38–126)
BUN: 13 mg/dL (ref 6–20)
CALCIUM: 9 mg/dL (ref 8.9–10.3)
CHLORIDE: 102 mmol/L (ref 101–111)
CO2: 27 mmol/L (ref 22–32)
Creatinine, Ser: 0.97 mg/dL (ref 0.61–1.24)
Glucose, Bld: 91 mg/dL (ref 65–99)
Potassium: 3.9 mmol/L (ref 3.5–5.1)
Sodium: 138 mmol/L (ref 135–145)
Total Bilirubin: 0.7 mg/dL (ref 0.3–1.2)
Total Protein: 6.9 g/dL (ref 6.5–8.1)

## 2018-03-23 LAB — CBC
HCT: 41.7 % (ref 40.0–52.0)
HEMOGLOBIN: 14.1 g/dL (ref 13.0–18.0)
MCH: 30.4 pg (ref 26.0–34.0)
MCHC: 33.8 g/dL (ref 32.0–36.0)
MCV: 89.9 fL (ref 80.0–100.0)
Platelets: 207 10*3/uL (ref 150–440)
RBC: 4.64 MIL/uL (ref 4.40–5.90)
RDW: 15.4 % — ABNORMAL HIGH (ref 11.5–14.5)
WBC: 4.5 10*3/uL (ref 3.8–10.6)

## 2018-03-23 LAB — TROPONIN I

## 2018-03-23 MED ORDER — ONDANSETRON 4 MG PO TBDP: 4.0000 mg | ORAL_TABLET | Freq: Three times a day (TID) | ORAL | 0 refills | Status: DC | PRN

## 2018-03-23 NOTE — ED Triage Notes (Signed)
Pt arrived via [facility] from [facility] care unit. Per EMS report, pt was experienced a syncopal episode at the facility and then vomited afterward. Pt is AOx4, he denies any chest pain, nausea, blurred vision or headache at this time. We will continue to monitor the pt.

## 2018-03-23 NOTE — ED Provider Notes (Signed)
[facility] Emergency Department Provider Note   ____________________________________________    I have reviewed the triage vital signs and the nursing notes.   HISTORY  Chief Complaint Nausea and vomiting dizziness  Patient has mild dementia  HPI Anthony Thornton is a 82 y.o. male who presents with an episode of nausea and vomiting.  Apparently the patient vomited twice this morning and felt dizzy thereafter.  He states now he feels much better.  Denies chest pain or palpitations.  Denies abdominal pain.  No bloody vomitus.  Has not taken anything for this.  No diarrhea reported.   Past Medical History:  Diagnosis Date  . Adjustment disorder with anxiety   . CVA (cerebral vascular accident) (HCC)   . Dementia   . Diabetes mellitus without complication (HCC)   . Glaucoma   . Hypercholesteremia   . Hypertension   . Pacemaker   . PE (pulmonary thromboembolism) [facility])     Patient Active Problem List   Diagnosis Date Noted  . Acute bronchitis 02/20/2018  . 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 Vascular dementia 08/23/2017    Past Surgical History:  Procedure Laterality Date  . AORTIC VALVE REPLACEMENT (AVR)/CORONARY ARTERY BYPASS GRAFTING (CABG)      Prior to Admission medications   Medication Sig Start Date End Date Taking? Authorizing Provider  aspirin 81 MG chewable tablet Chew 81 mg by mouth daily.   Yes [provider]  atorvastatin (LIPITOR) 80 MG tablet Take 80 mg by mouth at bedtime.    Yes [provider]  carvedilol (COREG) 3.125 MG tablet Take 1.5625 mg by mouth 2 (two) times daily with a meal.    Yes [provider]  clopidogrel (PLAVIX) 75 MG tablet Take 75 mg by mouth at bedtime.    Yes [provider]  divalproex (DEPAKOTE) 250 MG DR tablet Take 250-500 mg by mouth daily. -AM/500MG -PM   Yes [provider]  donepezil (ARICEPT) 10 MG tablet Take 10  mg by mouth at bedtime.    Yes [provider]  furosemide (LASIX) 20 MG tablet Take 20 mg by mouth daily. 10/28/16  Yes [provider]  guaiFENesin (MUCINEX) 600 MG 12 hr tablet Take 1 tablet (600 mg total) by mouth 2 (two) times daily. 02/23/18 02/23/19 Yes Salary, Evelena Asa, MD  lisinopril (PRINIVIL,ZESTRIL) 5 MG tablet Take 5 mg by mouth daily. 11/29/16  Yes [provider]  meclizine (ANTIVERT) 12.5 MG tablet Take 12.5 mg by mouth 2 (two) times daily as needed for nausea.    Yes [provider]  OLANZapine (ZYPREXA) 2.5 MG tablet Take 2.5 mg by mouth daily.   Yes [provider]  tiotropium (SPIRIVA) 18 MCG inhalation capsule Place 18 mcg into inhaler and inhale daily.   Yes [provider]  traZODone (DESYREL) 50 MG tablet Take 50 mg by mouth at bedtime.   Yes [provider]  albuterol (PROVENTIL HFA;VENTOLIN HFA) 108 (90 Base) MCG/ACT inhaler Inhale 2 puffs into the lungs every 6 (six) hours as needed for wheezing.    [provider]  albuterol (PROVENTIL) (2.5 MG/3ML) 0.083% nebulizer solution Take 3 mLs (2.5 mg total) by nebulization 2 (two) times daily for 10 days. 02/23/18 03/05/18  Salary, Jetty Duhamel D, MD  azithromycin (ZITHROMAX) 250 MG tablet 1 p.o. daily Patient not taking: Reported on 03/23/2018 02/23/18   Salary, Jetty Duhamel D, MD  cefdinir (OMNICEF) 300 MG capsule Take 1 capsule (300  mg total) by mouth 2 (two) times daily. Patient not taking: Reported on 03/23/2018 02/23/18   Salary, Evelena Asa, MD  LORazepam (ATIVAN) 1 MG tablet Take 0.5 tablets (0.5 mg total) by mouth daily as needed (agitation). 02/23/18   Salary, Evelena Asa, MD  ondansetron (ZOFRAN ODT) 4 MG disintegrating tablet Take 1 tablet (4 mg total) by mouth every 8 (eight) hours as needed for nausea or vomiting. 03/23/18   Jene Every, MD  predniSONE (DELTASONE) 50 MG tablet 1 p.o. daily Patient not taking: Reported on 03/23/2018 02/23/18   Salary, Evelena Asa, MD      Allergies Patient has no known allergies.  History reviewed. No pertinent family history.  Social History Social History   Tobacco Use  . Smoking status: Former Games developer  . Smokeless tobacco: Never Used  Substance Use Topics  . Alcohol use: No  . Drug use: No    Review of Systems  Constitutional: No chills Eyes: No visual changes.  ENT: No sore throat. Cardiovascular: Denies chest pain.  No palpitations Respiratory: Denies shortness of breath. Gastrointestinal: As above Genitourinary: Negative for dysuria. Musculoskeletal: Negative for joint swelling Skin: Negative for rash. Neurological: Negative for headaches   ____________________________________________   PHYSICAL EXAM:  VITAL SIGNS: ED Triage Vitals  Enc Vitals Group     BP 03/23/18 1202 135/72     Pulse Rate 03/23/18 1202 (!) 56     Resp 03/23/18 1202 (!) 22     Temp 03/23/18 1204 97.6 F (36.4 C)     Temp Source 03/23/18 1204 Oral     SpO2 03/23/18 1152 91 %     Weight 03/23/18 1204 93 kg (205 lb)     Height 03/23/18 1204 1.753 m ( )     Head Circumference --      Peak Flow --      Pain Score 03/23/18 1203 0     Pain Loc --      Pain Edu? --      Excl. in GC? --     Constitutional: Alert. No acute distress. Pleasant and interactive Eyes: Conjunctivae are normal.  PERRLA, EOMI Head: Atraumatic. Nose: No congestion/rhinnorhea. Mouth/Throat: Mucous membranes are moist.    Cardiovascular: Normal rate, regular rhythm. Grossly normal heart sounds.  Good peripheral circulation. Respiratory: Normal respiratory effort.  No retractions. Gastrointestinal: Soft and nontender. No distention.  Genitourinary: deferred Musculoskeletal: .  Warm and well perfused, normal strength in all extremities Neurologic:  Normal speech and language. No gross focal neurologic deficits are appreciated.  Cranial nerves II through XII are normal Skin:  Skin is warm, dry and intact. No rash noted. Psychiatric: Mood and  affect are normal. Speech and behavior are normal.  ____________________________________________   LABS (all labs ordered are listed, but only abnormal results are displayed)  Labs Reviewed  COMPREHENSIVE METABOLIC PANEL - Abnormal; Notable for the following components:      Result Value   AST 47 (*)    All other components within normal limits  CBC - Abnormal; Notable for the following components:   RDW 15.4 (*)    All other components within normal limits  TROPONIN I  URINALYSIS, COMPLETE (UACMP) WITH MICROSCOPIC  CBG MONITORING, ED   ____________________________________________  EKG  None ____________________________________________  RADIOLOGY  None ____________________________________________   PROCEDURES  Procedure(s) performed: No  Procedures   Critical Care performed: No ____________________________________________   INITIAL IMPRESSION / ASSESSMENT AND PLAN / ED COURSE  Pertinent labs & imaging results that were  available during my care of the patient were reviewed by me and considered in my medical decision making (see chart for details).  Patient presents after a brief episode of nausea and vomiting.  He appears to be at his baseline currently and feels quite well.  He has no complaints of abdominal pain chest pain or shortness of breath.  No fevers or chills.  Labs are unremarkable.  Will observe in the emergency department   Patient continues to feel well, his wife is here and states that he is at his baseline.  No further vomiting.  Vital signs unremarkable.  Appropriate for discharge at this time, return precautions discussed with patient and wife    ____________________________________________   FINAL CLINICAL IMPRESSION(S) / ED DIAGNOSES  Final diagnoses:  Non-intractable vomiting with nausea, unspecified vomiting type        Note:  This document was prepared using Dragon voice recognition software and may include unintentional  dictation errors.    Jene Every, MD 03/23/18 1447

## 2018-03-23 NOTE — ED Notes (Signed)
ED Provider at bedside. 

## 2018-03-23 NOTE — ED Notes (Signed)
EMS here to take pt back to [place] [facility]

## 2018-04-06 ENCOUNTER — Emergency Department: Payer: Medicare Other

## 2018-04-06 ENCOUNTER — Other Ambulatory Visit: Payer: Self-pay

## 2018-04-06 ENCOUNTER — Emergency Department
Admission: EM | Admit: 2018-04-06 | Discharge: 2018-04-06 | Disposition: A | Discharge status: Skilled Nursing Facility | Payer: Medicare Other | Attending: Emergency Medicine | Admitting: Emergency Medicine

## 2018-04-06 ENCOUNTER — Encounter: Payer: Self-pay | Admitting: Emergency Medicine

## 2018-04-06 DIAGNOSIS — Z043 Encounter for examination and observation following other accident: Secondary | ICD-10-CM | POA: Insufficient documentation

## 2018-04-06 DIAGNOSIS — Z951 Presence of aortocoronary bypass graft: Secondary | ICD-10-CM | POA: Diagnosis not present

## 2018-04-06 DIAGNOSIS — Y939 Activity, unspecified: Secondary | ICD-10-CM | POA: Insufficient documentation

## 2018-04-06 DIAGNOSIS — Z79899 Other long term (current) drug therapy: Secondary | ICD-10-CM | POA: Insufficient documentation

## 2018-04-06 DIAGNOSIS — Z7982 Long term (current) use of aspirin: Secondary | ICD-10-CM | POA: Insufficient documentation

## 2018-04-06 DIAGNOSIS — Z9181 History of falling: Secondary | ICD-10-CM | POA: Insufficient documentation

## 2018-04-06 DIAGNOSIS — E119 Type 2 diabetes mellitus without complications: Secondary | ICD-10-CM | POA: Insufficient documentation

## 2018-04-06 DIAGNOSIS — F0390 Unspecified dementia without behavioral disturbance: Secondary | ICD-10-CM | POA: Diagnosis present

## 2018-04-06 DIAGNOSIS — Z87891 Personal history of nicotine dependence: Secondary | ICD-10-CM | POA: Diagnosis not present

## 2018-04-06 DIAGNOSIS — W010XXA Fall on same level from slipping, tripping and stumbling without subsequent striking against object, initial encounter: Secondary | ICD-10-CM | POA: Insufficient documentation

## 2018-04-06 DIAGNOSIS — Y999 Unspecified external cause status: Secondary | ICD-10-CM | POA: Insufficient documentation

## 2018-04-06 DIAGNOSIS — Z950 Presence of cardiac pacemaker: Secondary | ICD-10-CM | POA: Diagnosis not present

## 2018-04-06 DIAGNOSIS — I10 Essential (primary) hypertension: Secondary | ICD-10-CM | POA: Diagnosis not present

## 2018-04-06 DIAGNOSIS — Y92129 Unspecified place in nursing home as the place of occurrence of the external cause: Secondary | ICD-10-CM | POA: Diagnosis not present

## 2018-04-06 DIAGNOSIS — Z7902 Long term (current) use of antithrombotics/antiplatelets: Secondary | ICD-10-CM | POA: Diagnosis not present

## 2018-04-06 DIAGNOSIS — W19XXXA Unspecified fall, initial encounter: Secondary | ICD-10-CM

## 2018-04-06 LAB — VALPROIC ACID LEVEL: VALPROIC ACID LVL: 36 ug/mL — AB (ref 50.0–100.0)

## 2018-04-06 NOTE — ED Notes (Signed)
Patient not complaining of any pain at this time. States, "I just lost my balance and fell." Denies any pain. Able to move all extremities freely.

## 2018-04-06 NOTE — Discharge Instructions (Addendum)
please follow-up with your regular doctor return for any further problems

## 2018-04-06 NOTE — ED Triage Notes (Signed)
Patient to ED from [facility] (memory care) via [facility]. Per EMS, staff states patient has fallen 3 times today. States patient has multiple falls every day due to "tripping over his feet". Patient denies any pain. NAD noted.

## 2018-04-06 NOTE — ED Notes (Signed)
Pt unable to e-sign for d/c.

## 2018-04-06 NOTE — ED Provider Notes (Signed)
[facility] Emergency Department Provider Note   ____________________________________________   First MD Initiated Contact with Patient 04/06/18 1915     (approximate)  I have reviewed the triage vital signs and the nursing notes.   HISTORY  Chief Complaint Fall    HPI Anthony Thornton is a 82 y.o. male Who comes from [facility]. Staff reported he fell 3 times today and they say falls multiple times today due to tripping over his feet. Patient has been here previously and has had blood work etc. which has been unrevealing. CT today is also unrevealing. 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   Past Medical History:  Diagnosis Date  . Adjustment disorder with anxiety   . CVA (cerebral vascular accident) (HCC)   . Dementia   . Diabetes mellitus without complication (HCC)   . Glaucoma   . Hypercholesteremia   . Hypertension   . Pacemaker   . PE (pulmonary thromboembolism) [facility])     Patient Active Problem List   Diagnosis Date Noted  . Acute bronchitis 02/20/2018  . 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 Vascular dementia 08/23/2017    Past Surgical History:  Procedure Laterality Date  . AORTIC VALVE REPLACEMENT (AVR)/CORONARY ARTERY BYPASS GRAFTING (CABG)      Prior to Admission medications   Medication Sig Start Date End Date Taking? Authorizing Provider  albuterol (PROVENTIL HFA;VENTOLIN HFA) 108 (90 Base) MCG/ACT inhaler Inhale 2 puffs into the lungs 4 (four) times daily as needed for wheezing.    Yes [provider]  aspirin 81 MG chewable tablet Chew 81 mg by mouth daily.   Yes [provider]  atorvastatin (LIPITOR) 80 MG tablet Take 80 mg by mouth at bedtime.    Yes [provider]  carvedilol (COREG) 3.125 MG tablet Take 1.5625 mg by mouth 2 (two) times daily with a meal.     Yes [provider]  clopidogrel (PLAVIX) 75 MG tablet Take 75 mg by mouth at bedtime.    Yes [provider]  divalproex (DEPAKOTE) 250 MG DR tablet Take 1 tablet ( ) by mouth every morning and 2 tablets ( ) by mouth every night   Yes [provider]  donepezil (ARICEPT) 10 MG tablet Take 10 mg by mouth at bedtime.    Yes [provider]  furosemide (LASIX) 20 MG tablet Take 20 mg by mouth daily. 10/28/16  Yes [provider]  guaiFENesin (MUCINEX) 600 MG 12 hr tablet Take 1 tablet (600 mg total) by mouth 2 (two) times daily. 02/23/18 02/23/19 Yes Salary, Evelena Asa, MD  lisinopril (PRINIVIL,ZESTRIL) 5 MG tablet Take 5 mg by mouth daily. 11/29/16  Yes [provider]  LORazepam (ATIVAN) 1 MG tablet Take 0.5 tablets (0.5 mg total) by mouth daily as needed (agitation). 02/23/18  Yes Salary, Evelena Asa, MD  meclizine (ANTIVERT) 12.5 MG tablet Take 12.5 mg by mouth 2 (two) times daily as needed for nausea.    Yes [provider]  OLANZapine (ZYPREXA) 2.5 MG tablet Take 2.5 mg by mouth daily.   Yes [provider]  Tiotropium Bromide Monohydrate 1.25 MCG/ACT AERS Inhale 1.25 mcg into the lungs every morning.    Yes [provider]  traZODone (DESYREL) 50 MG tablet Take 75 mg by mouth at bedtime.    Yes [provider]  albuterol (PROVENTIL) (  2.5 MG/3ML) 0.083% nebulizer solution Take 3 mLs (2.5 mg total) by nebulization 2 (two) times daily for 10 days. 02/23/18 03/05/18  Salary, Evelena Asa, MD  ondansetron (ZOFRAN ODT) 4 MG disintegrating tablet Take 1 tablet (4 mg total) by mouth every 8 (eight) hours as needed for nausea or vomiting. Patient not taking: Reported on 04/06/2018 03/23/18   Jene Every, MD    Allergies Patient has no known allergies.  History reviewed. No pertinent family history.  Social History Social History   Tobacco Use  . Smoking status: Former Games developer  . Smokeless tobacco: Never  Used  Substance Use Topics  . Alcohol use: No  . Drug use: No    Review of Systems  Constitutional: No fever/chills Eyes: No visual changes. ENT: No sore throat. Cardiovascular: Denies chest pain. Respiratory: Denies shortness of breath. Gastrointestinal: No abdominal pain.  No nausea, no vomiting.  No diarrhea.  No constipation. Genitourinary: Negative for dysuria. Musculoskeletal: Negative for back pain. Skin: Negative for rash. Neurological: Negative for headaches, focal weakness   ____________________________________________   PHYSICAL EXAM:  VITAL SIGNS: ED Triage Vitals  Enc Vitals Group     BP 04/06/18 1855 132/62     Pulse Rate 04/06/18 1855 (!) 54     Resp 04/06/18 1855 18     Temp 04/06/18 1855 (!) 97.4 F (36.3 C)     Temp src --      SpO2 04/06/18 1855 98 %     Weight 04/06/18 1856 200 lb 9.9 oz (91 kg)     Height 04/06/18 1856 6' (1.829 m)     Head Circumference --      Peak Flow --      Pain Score --      Pain Loc --      Pain Edu? --      Excl. in GC? --     Constitutional: Alert and oriented. Well appearing and in no acute distress. Eyes: Conjunctivae are normal.  Head: Atraumatic. Nose: No congestion/rhinnorhea. Mouth/Throat: Mucous membranes are moist.  Oropharynx non-erythematous. Neck: No stridor.   Cardiovascular: Normal rate, regular rhythm. Grossly normal heart sounds.  Good peripheral circulation. Respiratory: Normal respiratory effort.  No retractions. Lungs CTAB. Gastrointestinal: Soft and nontender. No distention. No abdominal bruits. No CVA tenderness. Musculoskeletal: No lower extremity tenderness nor edema.  swan-neck deformity and one finger on the left hand additionally has some spastic problems with that hand Neurologic:  Normal speech and language. No gross focal neurologic deficits are appreciated.except for the hand as mentioned above Skin:  Skin is warm, dry and intact. No rash noted. Psychiatric: Mood and affect are normal.  Speech and behavior are normal.  ____________________________________________   LABS (all labs ordered are listed, but only abnormal results are displayed)  Labs Reviewed  VALPROIC ACID LEVEL - Abnormal; Notable for the following components:      Result Value   Valproic Acid Lvl 36 (*)    All other components within normal limits   ____________________________________________  EKG   ____________________________________________  RADIOLOGY  ED MD interpretation: CT of the head and neck show no acute disease per radiology  Official radiology report(s): Ct Head Wo Contrast  Result Date: 04/06/2018 CLINICAL DATA:  Patient to ED from [facility] (memory care) via [facility]. Per EMS, staff states patient has fallen 3 times today. States patient has multiple falls every day due to "tripping over his feet". Patient denies any pain EXAM: CT HEAD WITHOUT CONTRAST CT CERVICAL SPINE WITHOUT  CONTRAST TECHNIQUE: Multidetector CT imaging of the head and cervical spine was performed following the standard protocol without intravenous contrast. Multiplanar CT image reconstructions of the cervical spine were also generated. COMPARISON:  03/12/2018 FINDINGS: CT HEAD FINDINGS Brain: There is significant central and cortical atrophy. Periventricular white matter changes are consistent with small vessel disease. There is encephalomalacia of the LEFT frontal lobe and RIGHT posterior parietal lobe consistent with remote infarcts. There has been near complete resolution of LEFT posterior parietal subdural hematoma. No new intra or extra-axial fluid collection. Vascular: There is atherosclerotic calcifications of the carotic siphons. Skull: Normal. Negative for fracture or focal lesion. Sinuses/Orbits: No acute finding. Other: None CT CERVICAL SPINE FINDINGS Alignment: There is loss of cervical lordosis. This may be secondary to splinting, soft tissue injury, or positioning. base and vertebrae: No acute fracture. No  primary bone lesion or focal pathologic process. Soft tissues and spinal canal: No prevertebral fluid or swelling. No visible canal hematoma. There is significant shotty calcification in the carotid bulbs. Disc levels:  Disc height loss primarily at C5-6. Upper chest: Previous median sternotomy. Other: None IMPRESSION: 1. Atrophy and small vessel disease. 2. Remote LEFT frontal and RIGHT posterior parietal infarct. 3. Near complete resolution of LEFT posterior parietal subdural hematoma. 4. No evidence for acute intracranial abnormality. 5. Degenerative changes in the cervical spine without acute abnormality. Electronically Signed   By: Norva Pavlov M.D.   On: 04/06/2018 19:52   Ct Cervical Spine Wo Contrast  Result Date: 04/06/2018 CLINICAL DATA:  Patient to ED from [facility] (memory care[facility] via [facility]. Per EMS, staff states patient has fallen 3 times today. States patient has multiple falls every day due to "tripping over his feet". Patient denies any pain EXAM: CT HEAD WITHOUT CONTRAST CT CERVICAL SPINE WITHOUT CONTRAST TECHNIQUE: Multidetector CT imaging of the head and cervical spine was performed following the standard protocol without intravenous contrast. Multiplanar CT image reconstructions of the cervical spine were also generated. COMPARISON:  03/12/2018 FINDINGS: CT HEAD FINDINGS Brain: There is significant central and cortical atrophy. Periventricular white matter changes are consistent with small vessel disease. There is encephalomalacia of the LEFT frontal lobe and RIGHT posterior parietal lobe consistent with remote infarcts. There has been near complete resolution of LEFT posterior parietal subdural hematoma. No new intra or extra-axial fluid collection. Vascular: There is atherosclerotic calcifications of the carotic siphons. Skull: Normal. Negative for fracture or focal lesion. Sinuses/Orbits: No acute finding. Other: None CT CERVICAL SPINE FINDINGS Alignment: There is loss of cervical  lordosis. This may be secondary to splinting, soft tissue injury, or positioning. base and vertebrae: No acute fracture. No primary bone lesion or focal pathologic process. Soft tissues and spinal canal: No prevertebral fluid or swelling. No visible canal hematoma. There is significant shotty calcification in the carotid bulbs. Disc levels:  Disc height loss primarily at C5-6. Upper chest: Previous median sternotomy. Other: None IMPRESSION: 1. Atrophy and small vessel disease. 2. Remote LEFT frontal and RIGHT posterior parietal infarct. 3. Near complete resolution of LEFT posterior parietal subdural hematoma. 4. No evidence for acute intracranial abnormality. 5. Degenerative changes in the cervical spine without acute abnormality. Electronically Signed   By: Norva Pavlov M.D.   On: 04/06/2018 19:52    ____________________________________________   PROCEDURES  Procedure(s) performed:   Procedures  Critical Care performed:   ____________________________________________   INITIAL IMPRESSION / ASSESSMENT AND PLAN / ED COURSE  patient with long history of falling. Previous evaluation and CTs today show  no particular cause for it. We'll refer patient to his primary care doctor for further evaluation. Patient has no injury today.          ____________________________________________   FINAL CLINICAL IMPRESSION(S) / ED DIAGNOSES  Final diagnoses:  Fall, initial encounter     ED Discharge Orders    None       Note:  This document was prepared using Dragon voice recognition software and may include unintentional dictation errors.    Arnaldo Natal, MD 04/06/18 2204

## 2018-04-30 ENCOUNTER — Inpatient Hospital Stay
Admission: EM | Admit: 2018-04-30 | Discharge: 2018-05-02 | DRG: 871 | Disposition: A | Discharge status: Home Health | Payer: Medicare Other | Attending: Internal Medicine | Admitting: Internal Medicine

## 2018-04-30 ENCOUNTER — Emergency Department: Payer: Medicare Other

## 2018-04-30 ENCOUNTER — Other Ambulatory Visit: Payer: Self-pay

## 2018-04-30 DIAGNOSIS — A419 Sepsis, unspecified organism: Principal | ICD-10-CM | POA: Diagnosis present

## 2018-04-30 DIAGNOSIS — IMO0001 Reserved for inherently not codable concepts without codable children: Secondary | ICD-10-CM

## 2018-04-30 DIAGNOSIS — Z950 Presence of cardiac pacemaker: Secondary | ICD-10-CM | POA: Diagnosis not present

## 2018-04-30 DIAGNOSIS — J189 Pneumonia, unspecified organism: Secondary | ICD-10-CM | POA: Diagnosis present

## 2018-04-30 DIAGNOSIS — F4322 Adjustment disorder with anxiety: Secondary | ICD-10-CM | POA: Diagnosis present

## 2018-04-30 DIAGNOSIS — I10 Essential (primary) hypertension: Secondary | ICD-10-CM | POA: Diagnosis present

## 2018-04-30 DIAGNOSIS — Z79899 Other long term (current) drug therapy: Secondary | ICD-10-CM

## 2018-04-30 DIAGNOSIS — H409 Unspecified glaucoma: Secondary | ICD-10-CM | POA: Diagnosis present

## 2018-04-30 DIAGNOSIS — Z7902 Long term (current) use of antithrombotics/antiplatelets: Secondary | ICD-10-CM

## 2018-04-30 DIAGNOSIS — Z8673 Personal history of transient ischemic attack (TIA), and cerebral infarction without residual deficits: Secondary | ICD-10-CM

## 2018-04-30 DIAGNOSIS — Z86711 Personal history of pulmonary embolism: Secondary | ICD-10-CM

## 2018-04-30 DIAGNOSIS — R748 Abnormal levels of other serum enzymes: Secondary | ICD-10-CM | POA: Diagnosis present

## 2018-04-30 DIAGNOSIS — R778 Other specified abnormalities of plasma proteins: Secondary | ICD-10-CM

## 2018-04-30 DIAGNOSIS — R6520 Severe sepsis without septic shock: Secondary | ICD-10-CM | POA: Diagnosis present

## 2018-04-30 DIAGNOSIS — I248 Other forms of acute ischemic heart disease: Secondary | ICD-10-CM | POA: Diagnosis present

## 2018-04-30 DIAGNOSIS — Z87891 Personal history of nicotine dependence: Secondary | ICD-10-CM

## 2018-04-30 DIAGNOSIS — Z951 Presence of aortocoronary bypass graft: Secondary | ICD-10-CM

## 2018-04-30 DIAGNOSIS — E7800 Pure hypercholesterolemia, unspecified: Secondary | ICD-10-CM | POA: Diagnosis present

## 2018-04-30 DIAGNOSIS — N179 Acute kidney failure, unspecified: Secondary | ICD-10-CM | POA: Diagnosis present

## 2018-04-30 DIAGNOSIS — Z7951 Long term (current) use of inhaled steroids: Secondary | ICD-10-CM | POA: Diagnosis not present

## 2018-04-30 DIAGNOSIS — Z7982 Long term (current) use of aspirin: Secondary | ICD-10-CM | POA: Diagnosis not present

## 2018-04-30 DIAGNOSIS — J181 Lobar pneumonia, unspecified organism: Secondary | ICD-10-CM | POA: Diagnosis present

## 2018-04-30 DIAGNOSIS — Z952 Presence of prosthetic heart valve: Secondary | ICD-10-CM | POA: Diagnosis not present

## 2018-04-30 DIAGNOSIS — E860 Dehydration: Secondary | ICD-10-CM | POA: Diagnosis present

## 2018-04-30 DIAGNOSIS — E119 Type 2 diabetes mellitus without complications: Secondary | ICD-10-CM | POA: Diagnosis present

## 2018-04-30 DIAGNOSIS — F0390 Unspecified dementia without behavioral disturbance: Secondary | ICD-10-CM | POA: Diagnosis present

## 2018-04-30 DIAGNOSIS — R296 Repeated falls: Secondary | ICD-10-CM | POA: Diagnosis present

## 2018-04-30 DIAGNOSIS — R7989 Other specified abnormal findings of blood chemistry: Secondary | ICD-10-CM

## 2018-04-30 LAB — COMPREHENSIVE METABOLIC PANEL
ALT: 107 U/L — AB (ref 17–63)
ANION GAP: 13 (ref 5–15)
AST: 117 U/L — ABNORMAL HIGH (ref 15–41)
Albumin: 3.6 g/dL (ref 3.5–5.0)
Alkaline Phosphatase: 68 U/L (ref 38–126)
BUN: 22 mg/dL — ABNORMAL HIGH (ref 6–20)
CALCIUM: 9.1 mg/dL (ref 8.9–10.3)
CO2: 21 mmol/L — ABNORMAL LOW (ref 22–32)
Chloride: 103 mmol/L (ref 101–111)
Creatinine, Ser: 1.5 mg/dL — ABNORMAL HIGH (ref 0.61–1.24)
GFR, EST AFRICAN AMERICAN: 49 mL/min — AB (ref >60)
GFR, EST NON AFRICAN AMERICAN: 42 mL/min — AB (ref >60)
Glucose, Bld: 128 mg/dL — ABNORMAL HIGH (ref 65–99)
Potassium: 3.8 mmol/L (ref 3.5–5.1)
Sodium: 137 mmol/L (ref 135–145)
Total Bilirubin: 0.8 mg/dL (ref 0.3–1.2)
Total Protein: 6.9 g/dL (ref 6.5–8.1)

## 2018-04-30 LAB — URINALYSIS, COMPLETE (UACMP) WITH MICROSCOPIC
Bacteria, UA: NONE SEEN
Bilirubin Urine: NEGATIVE
Glucose, UA: NEGATIVE mg/dL
Hgb urine dipstick: NEGATIVE
Ketones, ur: 5 mg/dL — AB
Leukocytes, UA: NEGATIVE
NITRITE: NEGATIVE
PH: 5 (ref 5.0–8.0)
Protein, ur: 30 mg/dL — AB
SPECIFIC GRAVITY, URINE: 1.019 (ref 1.005–1.030)

## 2018-04-30 LAB — CBC
HCT: 44.4 % (ref 40.0–52.0)
Hemoglobin: 15.2 g/dL (ref 13.0–18.0)
MCH: 30.4 pg (ref 26.0–34.0)
MCHC: 34.1 g/dL (ref 32.0–36.0)
MCV: 89 fL (ref 80.0–100.0)
PLATELETS: 179 10*3/uL (ref 150–440)
RBC: 4.99 MIL/uL (ref 4.40–5.90)
RDW: 14.8 % — ABNORMAL HIGH (ref 11.5–14.5)
WBC: 17.5 10*3/uL — AB (ref 3.8–10.6)

## 2018-04-30 LAB — MRSA PCR SCREENING: MRSA by PCR: NEGATIVE

## 2018-04-30 LAB — GLUCOSE, CAPILLARY
GLUCOSE-CAPILLARY: 140 mg/dL — AB (ref 65–99)
Glucose-Capillary: 103 mg/dL — ABNORMAL HIGH (ref 65–99)
Glucose-Capillary: 103 mg/dL — ABNORMAL HIGH (ref 65–99)

## 2018-04-30 LAB — LACTIC ACID, PLASMA
Lactic Acid, Venous: 3.9 mmol/L (ref 0.5–1.9)
Lactic Acid, Venous: 4.6 mmol/L (ref 0.5–1.9)
Lactic Acid, Venous: 2.9 mmol/L (ref 0.5–1.9)

## 2018-04-30 LAB — TROPONIN I
TROPONIN I: 0.13 ng/mL — AB (ref <0.03)
TROPONIN I: 0.36 ng/mL — AB (ref <0.03)
TROPONIN I: 0.39 ng/mL — AB (ref <0.03)

## 2018-04-30 MED ORDER — DONEPEZIL HCL 5 MG PO TABS
10.0000 mg | ORAL_TABLET | Freq: Every day | ORAL | Status: DC
  Administered 2018-04-30 – 2018-05-01 (×2): 10 mg via ORAL
  Filled 2018-04-30 (×3): qty 2

## 2018-04-30 MED ORDER — ENOXAPARIN SODIUM 40 MG/0.4ML ~~LOC~~ SOLN
40.0000 mg | SUBCUTANEOUS | Status: DC
  Administered 2018-04-30 – 2018-05-01 (×2): 40 mg via SUBCUTANEOUS
  Filled 2018-04-30 (×2): qty 0.4

## 2018-04-30 MED ORDER — BISACODYL 5 MG PO TBEC
5.0000 mg | DELAYED_RELEASE_TABLET | Freq: Every day | ORAL | Status: DC | PRN
Start: 2018-04-30 — End: 2018-05-02

## 2018-04-30 MED ORDER — VANCOMYCIN HCL IN DEXTROSE 1-5 GM/200ML-% IV SOLN: 1000.0000 mg | Freq: Once | INTRAVENOUS | Status: DC

## 2018-04-30 MED ORDER — ONDANSETRON HCL 4 MG/2ML IJ SOLN: 4.0000 mg | Freq: Four times a day (QID) | INTRAMUSCULAR | Status: DC | PRN

## 2018-04-30 MED ORDER — SODIUM CHLORIDE 0.9 % IV SOLN
2.0000 g | INTRAVENOUS | Status: DC
  Administered 2018-04-30 – 2018-05-02 (×3): 2 g via INTRAVENOUS
  Filled 2018-04-30: qty 2
  Filled 2018-04-30 (×2): qty 20
  Filled 2018-04-30: qty 2

## 2018-04-30 MED ORDER — LISINOPRIL 5 MG PO TABS
5.0000 mg | ORAL_TABLET | Freq: Every day | ORAL | Status: DC
  Administered 2018-05-02: 5 mg via ORAL
  Filled 2018-04-30: qty 1

## 2018-04-30 MED ORDER — SODIUM CHLORIDE 0.9 % IV SOLN
500.0000 mg | INTRAVENOUS | Status: DC
  Administered 2018-04-30 – 2018-05-02 (×3): 500 mg via INTRAVENOUS
  Filled 2018-04-30 (×4): qty 500

## 2018-04-30 MED ORDER — SENNOSIDES-DOCUSATE SODIUM 8.6-50 MG PO TABS: 1.0000 | ORAL_TABLET | Freq: Every evening | ORAL | Status: DC | PRN

## 2018-04-30 MED ORDER — CARVEDILOL 3.125 MG PO TABS
1.5625 mg | ORAL_TABLET | Freq: Two times a day (BID) | ORAL | Status: DC
  Filled 2018-04-30 (×3): qty 1

## 2018-04-30 MED ORDER — ATORVASTATIN CALCIUM 20 MG PO TABS
80.0000 mg | ORAL_TABLET | Freq: Every day | ORAL | Status: DC
  Administered 2018-04-30 – 2018-05-01 (×2): 80 mg via ORAL
  Filled 2018-04-30 (×2): qty 4

## 2018-04-30 MED ORDER — INSULIN ASPART 100 UNIT/ML ~~LOC~~ SOLN: 0.0000 [IU] | Freq: Every day | SUBCUTANEOUS | Status: DC

## 2018-04-30 MED ORDER — TRAZODONE HCL 50 MG PO TABS
75.0000 mg | ORAL_TABLET | Freq: Every day | ORAL | Status: DC
  Administered 2018-04-30 – 2018-05-01 (×2): 75 mg via ORAL
  Filled 2018-04-30 (×2): qty 2

## 2018-04-30 MED ORDER — DIVALPROEX SODIUM 500 MG PO DR TAB
500.0000 mg | DELAYED_RELEASE_TABLET | Freq: Every day | ORAL | Status: DC
  Administered 2018-04-30 – 2018-05-01 (×2): 500 mg via ORAL
  Filled 2018-04-30 (×3): qty 1

## 2018-04-30 MED ORDER — LORAZEPAM 0.5 MG PO TABS: 0.5000 mg | ORAL_TABLET | Freq: Every day | ORAL | Status: DC | PRN

## 2018-04-30 MED ORDER — ONDANSETRON HCL 4 MG PO TABS: 4.0000 mg | ORAL_TABLET | Freq: Four times a day (QID) | ORAL | Status: DC | PRN

## 2018-04-30 MED ORDER — SODIUM CHLORIDE 0.9 % IV SOLN: 2.0000 g | Freq: Once | INTRAVENOUS | Status: DC

## 2018-04-30 MED ORDER — CLOPIDOGREL BISULFATE 75 MG PO TABS
75.0000 mg | ORAL_TABLET | Freq: Every day | ORAL | Status: DC
  Administered 2018-04-30 – 2018-05-01 (×2): 75 mg via ORAL
  Filled 2018-04-30 (×2): qty 1

## 2018-04-30 MED ORDER — OLANZAPINE 5 MG PO TABS
2.5000 mg | ORAL_TABLET | Freq: Every day | ORAL | Status: DC
  Administered 2018-04-30 – 2018-05-01 (×2): 2.5 mg via ORAL
  Filled 2018-04-30 (×2): qty 1

## 2018-04-30 MED ORDER — ALBUTEROL SULFATE (2.5 MG/3ML) 0.083% IN NEBU: 2.5000 mg | INHALATION_SOLUTION | RESPIRATORY_TRACT | Status: DC | PRN

## 2018-04-30 MED ORDER — HYDROCODONE-ACETAMINOPHEN 5-325 MG PO TABS: 1.0000 | ORAL_TABLET | ORAL | Status: DC | PRN

## 2018-04-30 MED ORDER — ASPIRIN 81 MG PO CHEW
81.0000 mg | CHEWABLE_TABLET | Freq: Every day | ORAL | Status: DC
  Administered 2018-04-30 – 2018-05-02 (×3): 81 mg via ORAL
  Filled 2018-04-30 (×3): qty 1

## 2018-04-30 MED ORDER — INSULIN ASPART 100 UNIT/ML ~~LOC~~ SOLN: 0.0000 [IU] | Freq: Three times a day (TID) | SUBCUTANEOUS | Status: DC

## 2018-04-30 MED ORDER — GUAIFENESIN ER 600 MG PO TB12
600.0000 mg | ORAL_TABLET | Freq: Two times a day (BID) | ORAL | Status: DC | PRN
  Administered 2018-05-02: 600 mg via ORAL
  Filled 2018-04-30: qty 1

## 2018-04-30 MED ORDER — ACETAMINOPHEN 650 MG RE SUPP: 650.0000 mg | Freq: Four times a day (QID) | RECTAL | Status: DC | PRN

## 2018-04-30 MED ORDER — MECLIZINE HCL 12.5 MG PO TABS
12.5000 mg | ORAL_TABLET | Freq: Two times a day (BID) | ORAL | Status: DC | PRN
  Filled 2018-04-30: qty 1

## 2018-04-30 MED ORDER — SODIUM CHLORIDE 0.9 % IV SOLN: 500.0000 mg | INTRAVENOUS | Status: DC

## 2018-04-30 MED ORDER — SODIUM CHLORIDE 0.9 % IV SOLN: 1.0000 g | INTRAVENOUS | Status: DC

## 2018-04-30 MED ORDER — ACETAMINOPHEN 325 MG PO TABS: 650.0000 mg | ORAL_TABLET | Freq: Four times a day (QID) | ORAL | Status: DC | PRN

## 2018-04-30 MED ORDER — TIOTROPIUM BROMIDE MONOHYDRATE 18 MCG IN CAPS
18.0000 ug | ORAL_CAPSULE | RESPIRATORY_TRACT | Status: DC
  Administered 2018-04-30 – 2018-05-02 (×3): 18 ug via RESPIRATORY_TRACT
  Filled 2018-04-30: qty 5

## 2018-04-30 MED ORDER — SODIUM CHLORIDE 0.9 % IV SOLN
INTRAVENOUS | Status: DC
  Administered 2018-04-30: 13:00:00 via INTRAVENOUS

## 2018-04-30 MED ORDER — DIVALPROEX SODIUM 250 MG PO DR TAB
250.0000 mg | DELAYED_RELEASE_TABLET | Freq: Every day | ORAL | Status: DC
  Administered 2018-05-01 – 2018-05-02 (×2): 250 mg via ORAL
  Filled 2018-04-30 (×2): qty 1

## 2018-04-30 NOTE — Progress Notes (Signed)
Lab notified nurse pts lactic acid 4.6. Md Paged and notified.

## 2018-04-30 NOTE — ED Notes (Signed)
Pt wife contacted to inform her of pt admission

## 2018-04-30 NOTE — Progress Notes (Signed)
Lab notified pts lactic 3.9. MD Imogene Burnhen Notified. No new orders.

## 2018-04-30 NOTE — ED Provider Notes (Signed)
[facility] Emergency Department Provider Note   ____________________________________________    I have reviewed the triage vital signs and the nursing notes.   HISTORY  Chief Complaint Fall   She limited by dementia  HPI Anthony Thornton is a 82 y.o. male with history of diabetes, dementia, CVA who presents after a fall.  This was apparently an unwitnessed fall.  Patient does not remember what happened.  He has no physical complaints.  Review of medical records demonstrates multiple falls in the past.  Apparently his wife wanted him evaluated in the emergency department.   Past Medical History:  Diagnosis Date  . Adjustment disorder with anxiety   . CVA (cerebral vascular accident) (HCC)   . Dementia   . Diabetes mellitus without complication (HCC)   . Glaucoma   . Hypercholesteremia   . Hypertension   . Pacemaker   . PE (pulmonary thromboembolism) [facility])     Patient Active Problem List   Diagnosis Date Noted  . Pneumonia 04/30/2018  . Acute bronchitis 02/20/2018  . 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 Vascular dementia 08/23/2017    Past Surgical History:  Procedure Laterality Date  . AORTIC VALVE REPLACEMENT (AVR)/CORONARY ARTERY BYPASS GRAFTING (CABG)      Prior to Admission medications   Medication Sig Start Date End Date Taking? Authorizing Provider  albuterol (PROVENTIL HFA;VENTOLIN HFA) 108 (90 Base) MCG/ACT inhaler Inhale 2 puffs into the lungs 4 (four) times daily as needed for wheezing.    Yes [provider]  aspirin 81 MG chewable tablet Chew 81 mg by mouth daily.   Yes [provider]  atorvastatin (LIPITOR) 80 MG tablet Take 80 mg by mouth at bedtime.    Yes [provider]  carvedilol (COREG) 3.125 MG tablet Take 1.5625 mg by mouth 2 (two) times daily with a meal.    Yes [provider]  clopidogrel (PLAVIX) 75 MG tablet Take 75 mg by mouth at bedtime.     Yes [provider]  divalproex (DEPAKOTE) 250 MG DR tablet Take 250-500 mg by mouth 2 (two) times daily. Take 1 tablet (250MG ) by mouth every morning and 2 tablets (500MG ) by mouth every night   Yes [provider]  donepezil (ARICEPT) 10 MG tablet Take 10 mg by mouth at bedtime.    Yes [provider]  furosemide (LASIX) 20 MG tablet Take 20 mg by mouth daily. 10/28/16  Yes [provider]  guaiFENesin (MUCINEX) 600 MG 12 hr tablet Take 1 tablet (600 mg total) by mouth 2 (two) times daily. Patient taking differently: Take 600 mg by mouth 2 (two) times daily as needed for to loosen phlegm.  02/23/18 02/23/19 Yes Salary, Evelena Asa, MD  lisinopril (PRINIVIL,ZESTRIL) 5 MG tablet Take 5 mg by mouth daily. 11/29/16  Yes [provider]  LORazepam (ATIVAN) 1 MG tablet Take 0.5 tablets (0.5 mg total) by mouth daily as needed (agitation). 02/23/18  Yes Salary, Evelena Asa, MD  meclizine (ANTIVERT) 12.5 MG tablet Take 12.5 mg by mouth 2 (two) times daily as needed for nausea.    Yes [provider]  OLANZapine (ZYPREXA) 2.5 MG tablet Take 2.5 mg by mouth at bedtime.    Yes [provider]  Tiotropium Bromide Monohydrate 1.25 MCG/ACT AERS Inhale 1.25 mcg into the lungs every morning.    Yes [provider]  traZODone (DESYREL) 50 MG tablet Take 75 mg by mouth at bedtime.  Yes [provider]  albuterol (PROVENTIL) (2.5 MG/3ML) 0.083% nebulizer solution Take 3 mLs (2.5 mg total) by nebulization 2 (two) times daily for 10 days. Patient not taking: Reported on 04/30/2018 02/23/18 03/05/18  Salary, Jetty DuhamelMontell D, MD  ondansetron (ZOFRAN ODT) 4 MG disintegrating tablet Take 1 tablet (4 mg total) by mouth every 8 (eight) hours as needed for nausea or vomiting. Patient not taking: Reported on 04/06/2018 03/23/18   Jene EveryKinner, Mechelle Pates, MD     Allergies Patient has no known allergies.  No family history on file.  Social History Social History    Tobacco Use  . Smoking status: Former Games developermoker  . Smokeless tobacco: Never Used  Substance Use Topics  . Alcohol use: No  . Drug use: No    Review of Systems 10 by dementia  Constitutional: Denies dizziness Eyes: No visual changes.  ENT: No neck pain Cardiovascular: Chest pain Respiratory: Denies shortness of breath. Gastrointestinal: No abdominal pain.    Genitourinary: Negative for groin pain Musculoskeletal: Negative for back pain. Skin: Negative for laceration Neurological: Negative for headaches   ____________________________________________   PHYSICAL EXAM:  VITAL SIGNS: ED Triage Vitals  Enc Vitals Group     BP 04/30/18 0821 125/78     Pulse Rate 04/30/18 0821 98     Resp 04/30/18 0821 17     Temp 04/30/18 0821 97.6 F (36.4 C)     Temp Source 04/30/18 0821 Oral     SpO2 04/30/18 0821 99 %     Weight 04/30/18 0823 90.7 kg (200 lb)     Height 04/30/18 0823 1.829 m (6')     Head Circumference --      Peak Flow --      Pain Score 04/30/18 0823 0     Pain Loc --      Pain Edu? --      Excl. in GC? --     Constitutional: Alert. No acute distress.  Eyes: Conjunctivae are normal.  Head: Atraumatic. Nose: No swelling or epistaxis Mouth/Throat: Mucous membranes are moist.   Neck:  Painless ROM, no vertebral tenderness to palpation Cardiovascular: Normal rate, regular rhythm. Grossly normal heart sounds.  Good peripheral circulation. Respiratory: Normal respiratory effort.  No retractions. Lungs CTAB. Gastrointestinal: Soft and nontender. No distention.    Musculoskeletal: No pain with axial load on both hips, full range of motion of the lower and upper extremities, no bony tenderness palpation.  Back: No vertebral tenderness palpation Neurologic:  Normal speech and language.  Skin:  Skin is warm, dry and intact.  Psychiatric: Mood and affect are normal. Speech and behavior are normal.  ____________________________________________   LABS (all labs  ordered are listed, but only abnormal results are displayed)  Labs Reviewed  CBC - Abnormal; Notable for the following components:      Result Value   WBC 17.5 (*)    RDW 14.8 (*)    All other components within normal limits  COMPREHENSIVE METABOLIC PANEL - Abnormal; Notable for the following components:   CO2 21 (*)    Glucose, Bld 128 (*)    BUN 22 (*)    Creatinine, Ser 1.50 (*)    AST 117 (*)    ALT 107 (*)    GFR calc non Af Amer 42 (*)    GFR calc Af Amer 49 (*)    All other components within normal limits  TROPONIN I - Abnormal; Notable for the following components:   Troponin I 0.13 (*)  All other components within normal limits  URINALYSIS, COMPLETE (UACMP) WITH MICROSCOPIC - Abnormal; Notable for the following components:   Color, Urine AMBER (*)    APPearance HAZY (*)    Ketones, ur 5 (*)    Protein, ur 30 (*)    All other components within normal limits  CULTURE, BLOOD (ROUTINE X 2)  CULTURE, BLOOD (ROUTINE X 2)  MRSA PCR SCREENING  LACTIC ACID, PLASMA  LACTIC ACID, PLASMA   ____________________________________________  EKG  ED ECG REPORT I, Jene Every, the attending physician, personally viewed and interpreted this ECG.  Date: 04/30/2018  Rhythm: Sinus tachycardia QRS Axis: normal Intervals: Right bundle branch block ST/T Wave abnormalities: normal Narrative Interpretation: no evidence of acute ischemia  ____________________________________________  RADIOLOGY  None ____________________________________________   PROCEDURES  Procedure(s) performed: No  Procedures   Critical Care performed: yes  CRITICAL CARE Performed by: Jene Every   Total critical care time: 30 minutes  Critical care time was exclusive of separately billable procedures and treating other patients.  Critical care was necessary to treat or prevent imminent or life-threatening deterioration.  Critical care was time spent personally by me on the following  activities: development of treatment plan with patient and/or surrogate as well as nursing, discussions with consultants, evaluation of patient's response to treatment, examination of patient, obtaining history from patient or surrogate, ordering and performing treatments and interventions, ordering and review of laboratory studies, ordering and review of radiographic studies, pulse oximetry and re-evaluation of patient's condition.  ____________________________________________   INITIAL IMPRESSION / ASSESSMENT AND PLAN / ED COURSE  Pertinent labs & imaging results that were available during my care of the patient were reviewed by me and considered in my medical decision making (see chart for details).  No evidence of injury on thorough exam.  Patient overall well-appearing in no acute distress.  Will check basic labs, EKG  Patient with elevated white blood cell counts, chest x-ray urinalysis ordered  Notified of elevated troponin of 0.13  Chest x-ray consistent with pneumonia, suspect elevated troponin is related to strain, no chest pain reported.  Will treat with Rocephin and azithromycin.  Admit to the hospitalist service    ____________________________________________   FINAL CLINICAL IMPRESSION(S) / ED DIAGNOSES  Final diagnoses:  Community acquired pneumonia of left lower lobe of lung (HCC)  Elevated troponin        Note:  This document was prepared using Dragon voice recognition software and may include unintentional dictation errors.    Jene Every, MD 04/30/18 1044

## 2018-04-30 NOTE — ED Triage Notes (Signed)
Pt arrives via [facility] from [facility]. PT had a mechanical fall no complaints wife requested pt come here for eval. Pt NAD awaiting EDP

## 2018-04-30 NOTE — ED Notes (Signed)
Pt states he feels he can provide urine sample. Pt unable to successfully provide urine at this time.

## 2018-04-30 NOTE — H&P (Signed)
[facility] [place] at [facility]   PATIENT NAME: Anthony Thornton    MR#:  [id number]  DATE OF BIRTH:  08/09/1936  DATE OF ADMISSION:  04/30/2018  PRIMARY CARE PHYSICIAN: Patient, No Pcp Per   REQUESTING/REFERRING PHYSICIAN: Dr. Cyril LoosenKinner  CHIEF COMPLAINT:   Chief Complaint  Patient presents with  . Fall   Multiple falls HISTORY OF PRESENT ILLNESS:  Anthony BooksGeorge Norgard  is a 82 y.o. male with a known history of multiple medical problems as below.  The patient is sent to ED due to multiple falls.  The patient is demented and unable to provide detailed information.  He only complains of generalized weakness and fall.  Chest x-ray showed left-sided pneumonia.  WBC is 17.5.  He is treated with antibiotics in the ED.  Dr. Cyril LoosenKinner requests admission.  PAST MEDICAL HISTORY:   Past Medical History:  Diagnosis Date  . Adjustment disorder with anxiety   . CVA (cerebral vascular accident) (HCC)   . Dementia   . Diabetes mellitus without complication (HCC)   . Glaucoma   . Hypercholesteremia   . Hypertension   . Pacemaker   . PE (pulmonary thromboembolism) (HCC)     PAST SURGICAL HISTORY:   Past Surgical History:  Procedure Laterality Date  . AORTIC VALVE REPLACEMENT (AVR)/CORONARY ARTERY BYPASS GRAFTING (CABG)      SOCIAL HISTORY:   Social History   Tobacco Use  . Smoking status: Former Games developermoker  . Smokeless tobacco: Never Used  Substance Use Topics  . Alcohol use: No    FAMILY HISTORY:  No family history on file.  Unable to obtain.  DRUG ALLERGIES:  No Known Allergies  REVIEW OF SYSTEMS:   Review of Systems  Unable to perform ROS: Dementia    MEDICATIONS AT HOME:   Prior to Admission medications   Medication Sig Start Date End Date Taking? Authorizing Provider  albuterol (PROVENTIL HFA;VENTOLIN HFA) 108 (90 Base) MCG/ACT inhaler Inhale 2 puffs into the lungs 4 (four) times daily as needed for wheezing.    Yes [provider]  aspirin 81 MG  chewable tablet Chew 81 mg by mouth daily.   Yes [provider]  atorvastatin (LIPITOR) 80 MG tablet Take 80 mg by mouth at bedtime.    Yes [provider]  carvedilol (COREG) 3.125 MG tablet Take 1.5625 mg by mouth 2 (two) times daily with a meal.    Yes [provider]  clopidogrel (PLAVIX) 75 MG tablet Take 75 mg by mouth at bedtime.    Yes [provider]  divalproex (DEPAKOTE) 250 MG DR tablet Take 250-500 mg by mouth 2 (two) times daily. Take 1 tablet (250MG ) by mouth every morning and 2 tablets (500MG ) by mouth every night   Yes [provider]  donepezil (ARICEPT) 10 MG tablet Take 10 mg by mouth at bedtime.    Yes [provider]  furosemide (LASIX) 20 MG tablet Take 20 mg by mouth daily. 10/28/16  Yes [provider]  guaiFENesin (MUCINEX) 600 MG 12 hr tablet Take 1 tablet (600 mg total) by mouth 2 (two) times daily. Patient taking differently: Take 600 mg by mouth 2 (two) times daily as needed for to loosen phlegm.  02/23/18 02/23/19 Yes Salary, Evelena AsaMontell D, MD  lisinopril (PRINIVIL,ZESTRIL) 5 MG tablet Take 5 mg by mouth daily. 11/29/16  Yes [provider]  LORazepam (ATIVAN) 1 MG tablet Take 0.5 tablets (0.5 mg total) by mouth daily as needed (agitation). 02/23/18  Yes Salary, Evelena Asa, MD  meclizine (ANTIVERT) 12.5 MG tablet Take 12.5 mg by mouth 2 (two) times daily as needed for nausea.    Yes [provider]  OLANZapine (ZYPREXA) 2.5 MG tablet Take 2.5 mg by mouth at bedtime.    Yes [provider]  Tiotropium Bromide Monohydrate 1.25 MCG/ACT AERS Inhale 1.25 mcg into the lungs every morning.    Yes [provider]  traZODone (DESYREL) 50 MG tablet Take 75 mg by mouth at bedtime.    Yes [provider]  albuterol (PROVENTIL) (2.5 MG/3ML) 0.083% nebulizer solution Take 3 mLs (2.5 mg total) by nebulization 2 (two) times daily for 10 days. Patient not taking: Reported on 04/30/2018  02/23/18 03/05/18  Salary, Jetty Duhamel D, MD  ondansetron (ZOFRAN ODT) 4 MG disintegrating tablet Take 1 tablet (4 mg total) by mouth every 8 (eight) hours as needed for nausea or vomiting. Patient not taking: Reported on 04/06/2018 03/23/18   Jene Every, MD      VITAL SIGNS:  Blood pressure 125/78, pulse 98, temperature 97.6 F (36.4 C), temperature source Oral, resp. rate 17, height 6' (1.829 m), weight 200 lb (90.7 kg), SpO2 99 %.  PHYSICAL EXAMINATION:  Physical Exam  GENERAL:  82 y.o.-year-old patient lying in the bed with no acute distress.  EYES: Pupils equal, round, reactive to light and accommodation. No scleral icterus. Extraocular muscles intact.  HEENT: Head atraumatic, normocephalic. Oropharynx and nasopharynx clear.  NECK:  Supple, no jugular venous distention. No thyroid enlargement, no tenderness.  LUNGS: Normal breath sounds bilaterally, no wheezing, rales,rhonchi or crepitation. No use of accessory muscles of respiration.  CARDIOVASCULAR: S1, S2 normal. No murmurs, rubs, or gallops.  ABDOMEN: Soft, nontender, nondistended. Bowel sounds present. No organomegaly or mass.  EXTREMITIES: No pedal edema, cyanosis, or clubbing.  NEUROLOGIC: Cranial nerves II through XII are intact. Muscle strength 4/5 in all extremities. Sensation intact. Gait not checked.  PSYCHIATRIC: The patient is alert and oriented x 2.  Demented. SKIN: No obvious rash, lesion, or ulcer.   LABORATORY PANEL:   CBC Recent Labs  Lab 04/30/18 0838  WBC 17.5*  HGB 15.2  HCT 44.4  PLT 179   ------------------------------------------------------------------------------------------------------------------  Chemistries  Recent Labs  Lab 04/30/18 0838  NA 137  K 3.8  CL 103  CO2 21*  GLUCOSE 128*  BUN 22*  CREATININE 1.50*  CALCIUM 9.1  AST 117*  ALT 107*  ALKPHOS 68  BILITOT 0.8    ------------------------------------------------------------------------------------------------------------------  Cardiac Enzymes Recent Labs  Lab 04/30/18 0838  TROPONINI 0.13*   ------------------------------------------------------------------------------------------------------------------  RADIOLOGY:  Dg Chest Port 1 View  Result Date: 04/30/2018 CLINICAL DATA:  Mechanical fall. EXAM: PORTABLE CHEST 1 VIEW COMPARISON:  02/20/2018. FINDINGS: Hazy airspace opacity projects in the central left lung, new when compared the prior exam. Remainder of the lungs is clear. No pleural effusion or pneumothorax. Stable changes from cardiac surgery. Cardiac silhouette is normal in size. No mediastinal or hilar masses. Skeletal structures are grossly intact. IMPRESSION: 1. New hazy airspace lung opacity in central left lung. This is consistent with pneumonia in the proper clinical setting. Electronically Signed   By: Amie Portland M.D.   On: 04/30/2018 09:58      IMPRESSION AND PLAN:   Left-sided pneumonia, CAP with leukocytosis. The patient will be admitted to medical floor. Continue antibiotics, follow-up CBC and cultures.  Robitussin as needed and DuoNeb as needed.  Acute renal failure due to dehydration.  Start normal saline IV and follow-up  BMP.  Hold the Lasix.  Elevated troponin.  Due to demanding ischemia, continue aspirin and Plavix.  Follow-up troponin.  Hypertension.  Continue home hypertension medication. History of CVA.  Continue aspirin and Plavix and statin. Diabetes.  Start sliding scale. Multiple falls and dementia.  PT evaluation and fall precaution. I called his wife, but nobody answered the phone. All the records are reviewed and case discussed with ED provider. Management plans discussed with the patient, family and they are in agreement.  CODE STATUS: Full code for now  TOTAL TIME TAKING CARE OF THIS PATIENT: 53 minutes.    Shaune Pollack M.D on 04/30/2018 at 10:35  AM  Between 7am to 6pm - Pager - [phone]  After 6pm go to [email or web address] - Social research officer, government  [facility] [place] [facility]  Office  [phone]  CC: Primary care physician; Patient, No Pcp Per   Note: This dictation was prepared with Dragon dictation along with smaller phrase technology. Any transcriptional errors that result from this process are unin

## 2018-04-30 NOTE — Progress Notes (Signed)
MRSA PCR sent 

## 2018-05-01 LAB — CBC
HCT: 35.2 % — ABNORMAL LOW (ref 40.0–52.0)
Hemoglobin: 12 g/dL — ABNORMAL LOW (ref 13.0–18.0)
MCH: 30.3 pg (ref 26.0–34.0)
MCHC: 34 g/dL (ref 32.0–36.0)
MCV: 89 fL (ref 80.0–100.0)
PLATELETS: 138 10*3/uL — AB (ref 150–440)
RBC: 3.95 MIL/uL — AB (ref 4.40–5.90)
RDW: 15.3 % — ABNORMAL HIGH (ref 11.5–14.5)
WBC: 19.4 10*3/uL — ABNORMAL HIGH (ref 3.8–10.6)

## 2018-05-01 LAB — BASIC METABOLIC PANEL
Anion gap: 6 (ref 5–15)
BUN: 23 mg/dL — ABNORMAL HIGH (ref 6–20)
CALCIUM: 8.3 mg/dL — AB (ref 8.9–10.3)
CO2: 23 mmol/L (ref 22–32)
CREATININE: 0.91 mg/dL (ref 0.61–1.24)
Chloride: 105 mmol/L (ref 101–111)
Glucose, Bld: 95 mg/dL (ref 65–99)
Potassium: 3.8 mmol/L (ref 3.5–5.1)
Sodium: 134 mmol/L — ABNORMAL LOW (ref 135–145)

## 2018-05-01 LAB — GLUCOSE, CAPILLARY
GLUCOSE-CAPILLARY: 70 mg/dL (ref 65–99)
GLUCOSE-CAPILLARY: 120 mg/dL — AB (ref 65–99)
GLUCOSE-CAPILLARY: 85 mg/dL (ref 65–99)
Glucose-Capillary: 107 mg/dL — ABNORMAL HIGH (ref 65–99)

## 2018-05-01 LAB — TROPONIN I
TROPONIN I: 0.22 ng/mL — AB (ref <0.03)
TROPONIN I: 0.3 ng/mL — AB (ref <0.03)

## 2018-05-01 LAB — STREP PNEUMONIAE URINARY ANTIGEN: STREP PNEUMO URINARY ANTIGEN: POSITIVE — AB

## 2018-05-01 NOTE — Clinical Social Work Placement (Signed)
   CLINICAL SOCIAL WORK PLACEMENT  NOTE  Date:  05/01/2018  Patient Details  Name: Anthony Thornton MRN: [id number] Date of Birth: 12/30/1935  Clinical Social Work is seeking post-discharge placement for this patient at the Skilled  Nursing Facility level of care (*CSW will initial, date and re-position this form in  chart as items are completed):  Yes   Patient/family provided with [place] Clinical Social Work Department's list of facilities offering this level of care within the geographic area requested by the patient (or if unable, by the patient's family).  Yes   Patient/family informed of their freedom to choose among providers that offer the needed level of care, that participate in Medicare, Medicaid or managed care program needed by the patient, have an available bed and are willing to accept the patient.  Yes   Patient/family informed of [place]'s ownership interest in [facility] and [facility], as well as of the fact that they are under no obligation to receive care at these facilities.  PASRR submitted to EDS on 05/01/18     PASRR number received on 05/01/18     Existing PASRR number confirmed on       FL2 transmitted to all facilities in geographic area requested by pt/family on 05/01/18     FL2 transmitted to all facilities within larger geographic area on       Patient informed that his/her managed care company has contracts with or will negotiate with certain facilities, including the following:        Yes   Patient/family informed of bed offers received.  Patient [facility] bed [facility]     Physician recommends and patient chooses bed at      Patient to be transferred to   on  .  Patient to be transferred to facility by       Patient family notified on   of transfer.  Name of family member notified:        PHYSICIAN       Additional Comment:    _______________________________________________ Yanna Leaks, Darleen CrockerBailey M,  LCSW 05/01/2018, 4:12 PM

## 2018-05-01 NOTE — Clinical Social Work Note (Signed)
Clinical Social Work Assessment  Patient Details  Name: Anthony Thornton MRN: [id number] Date of Birth: 1936/01/05  Date of referral:  05/01/18               Reason for consult:  Facility Placement                Permission sought to share information with:  Chartered certified accountant granted to share information::  Yes, Verbal Permission Granted  Name::      [place]::   [place]  Relationship::     Contact Information:     Housing/Transportation Living arrangements for the past 2 months:  [place] of Information:  Adult Children, Spouse Patient Interpreter Needed:  None Criminal Activity/Legal Involvement Pertinent to Current Situation/Hospitalization:  No - Comment as needed Significant Relationships:  Adult Children, Spouse Lives with:  Facility Resident Do you feel safe going back to the place where you live?    Need for family participation in patient care:  Yes (Comment)  Care giving concerns:  Patient is a resident at [facility].    Social Worker assessment / plan:  Holiday representative (CSW) reviewed chart and noted that patient is from [place] and PT is recommending SNF. CSW met with patient and his wife Anthony Thornton at bedside. Patient did not participate in assessment. Per wife patient has been at [facility] for 1 year now and was at [facility] prior to that. Per wife she was trying to place patient at [facility] for long term care however he was walking and doing well with mobility. CSW explained that PT is recommending SNF and that there is a chance that [facility] may not accept patient back. Wife verbalized her understanding and is agreeable to SNF search in [employer]. FL2 complete and faxed out, PASARR has been received.    CSW contacted patient's wife and presented the only bed offer [facility]. Per wife she will accept bed offer if [facility] will not accept patient back. Per [employer] staff member at [facility] he will come assess patient today to see if they can accept him back. Per [facility] admissions coordinator at [facility] she will start [facility] SN[facility] authorization today. Patient's son Elby is also aware of above. CSW will continue to follow and assist as needed.   Employment status:  Disabled (Comment on whether or not currently receiving Disability) Insurance information:  Managed Medicare PT Recommendations:  [place] / Referral to community resources:  Section  Patient/Family's Response to care:  Patient's wife is agreeable to D/C to [facility] if [facility] will not accept patient back.   Patient/Family's Understanding of and Emotional Response to Diagnosis, Current Treatment, and Prognosis:  Patient's wife was very pleasant and thanked CSW for assistance.   Emotional Assessment Appearance:  Appears stated age Attitude/Demeanor/Rapport:  Unable to Assess Affect (typically observed):  Unable to Assess Orientation:  Oriented to Self, Fluctuating Orientation (Suspected and/or reported Sundowners) Alcohol / Substance use:  Not Applicable Psych involvement (Current and /or in the community):  No (Comment)  Discharge Needs  Concerns to be addressed:  Discharge Planning Concerns Readmission within the last 30 days:  No Current discharge risk:  Dependent with Mobility, Cognitively Impaired, Chronically ill Barriers to Discharge:  Continued Medical Work up   [employer], Veronia Beets, LCSW 05/01/2018,  4:13 PM

## 2018-05-01 NOTE — Progress Notes (Signed)
[facility] - [place] at [facility]   PATIENT NAME: Anthony Thornton    MR#:  [id number]  DATE OF BIRTH:  09/27/1936  SUBJECTIVE:   Patient doing well this morning.  Patient has dementia.  REVIEW OF SYSTEMS:    Unable to obtain due to dementia  Tolerating Diet: yes      DRUG ALLERGIES:  No Known Allergies  VITALS:  Blood pressure 106/61, pulse 71, temperature 97.9 F (36.6 C), temperature source Oral, resp. rate 18, height 6' (1.829 m), weight 90.7 kg (200 lb), SpO2 90 %.  PHYSICAL EXAMINATION:  Constitutional: Appears well-developed and well-nourished. No distress. HENT: Normocephalic. Marland Kitchen. Oropharynx is clear and moist.  Eyes: Conjunctivae and EOM are normal. PERRLA, no scleral icterus.  Neck: Normal ROM. Neck supple. No JVD. No tracheal deviation. CVS: RRR, S1/S2 +, no murmurs, no gallops, no carotid bruit.  Pulmonary: Effort and breath sounds normal, no stridor, rhonchi, wheezes, rales.  Abdominal: Soft. BS +,  no distension, tenderness, rebound or guarding.  Musculoskeletal: Normal range of motion. No edema and no tenderness.  Neuro: Alert. CN 2-12 grossly intact. No focal deficits.  Oriented to name only not place or time Skin: Skin is warm and dry. No rash noted. Psychiatric: Dementia    LABORATORY PANEL:   CBC Recent Labs  Lab 05/01/18 0323  WBC 19.4*  HGB 12.0*  HCT 35.2*  PLT 138*   ------------------------------------------------------------------------------------------------------------------  Chemistries  Recent Labs  Lab 04/30/18 0838 05/01/18 0323  NA 137 134*  K 3.8 3.8  CL 103 105  CO2 21* 23  GLUCOSE 128* 95  BUN 22* 23*  CREATININE 1.50* 0.91  CALCIUM 9.1 8.3*  AST 117*  --   ALT 107*  --   ALKPHOS 68  --   BILITOT 0.8  --    ------------------------------------------------------------------------------------------------------------------  Cardiac Enzymes Recent Labs  Lab 04/30/18 0838 04/30/18 1328  04/30/18 1855  TROPONINI 0.13* 0.36* 0.39*   ------------------------------------------------------------------------------------------------------------------  RADIOLOGY:  Dg Chest Port 1 View  Result Date: 04/30/2018 CLINICAL DATA:  Mechanical fall. EXAM: PORTABLE CHEST 1 VIEW COMPARISON:  02/20/2018. FINDINGS: Hazy airspace opacity projects in the central left lung, new when compared the prior exam. Remainder of the lungs is clear. No pleural effusion or pneumothorax. Stable changes from cardiac surgery. Cardiac silhouette is normal in size. No mediastinal or hilar masses. Skeletal structures are grossly intact. IMPRESSION: 1. New hazy airspace lung opacity in central left lung. This is consistent with pneumonia in the proper clinical setting. Electronically Signed   By: Amie Portlandavid  Ormond M.D.   On: 04/30/2018 09:58     ASSESSMENT AND PLAN:    82 year old male with history of dementia, CVA and diabetes who presents to the emergency room after unwitnessed fall.  1.  Sepsis with tachypnea and leukocytosis due to left long pneumonia: Follow-up on urine culture Continue Rocephin and azithromycin with plans to change to oral tomorrow Follow CBC 2.  Acute kidney injury in the setting of sepsis: This is improved with IV fluids  3.  Elevated troponin: This is due to demand ischemia.  4.  Dementia: Continue Aricept  5.  History of CVA: Continue Plavix and aspirin and statin  Physical therapy is recommending skilled nursing facility upon discharge Social worker consult placed  Management plans discussed with nursing  CODE STATUS: FULL  TOTAL TIME TAKING CARE OF THIS PATIENT: 30 minutes.     POSSIBLE D/C tomorrow, DEPENDING ON CLINICAL CONDITION.   Kamaria Lucia M.D on 05/01/2018 at  10:26 AM  Between 7am to 6pm - Pager - [phone] After 6pm go to [email or web address] - password [email or web address]  [facility] [place] [facility]  Office  [phone]  CC: Primary care physician; Patient, No Pcp  Per  Note: This dictation was prepared with Dragon dictation along with smaller phrase technology. Any transcriptional errors that result from this process are unintentional.

## 2018-05-01 NOTE — [place] FL2 (Signed)
[place] MEDICAID FL2 LEVEL OF CARE SCREENING TOOL     IDENTIFICATION  Patient Name: Anthony Thornton Birthdate: [birth year] Sex: male Admission Date (Current Location): 04/30/2018  [place] and [place] Number:  [address] ([id number] [id number]) Facility and Address:  [facility], [address], [place], [place] [address]      Provider Number: [id number]  Attending Physician Name and Address:  Adrian Saran, MD  Relative Name and Phone Number:       Current Level of Care: Hospital Recommended Level of Care: Skilled Nursing Facility Prior Approval Number:    Date Approved/Denied:   PASRR Number:    Discharge Plan: SNF    Current Diagnoses: Patient Active Problem List   Diagnosis Date Noted  . Pneumonia 04/30/2018  . Acute bronchitis 02/20/2018  . 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 Vascular dementia 08/23/2017    Orientation RESPIRATION BLADDER Height & Weight     Self  Normal Incontinent Weight: 200 lb (90.7 kg) Height:  6' (182.9 cm)  BEHAVIORAL SYMPTOMS/MOOD NEUROLOGICAL BOWEL NUTRITION STATUS      Continent Diet(Diet: Heart Healthy/ Carb Modified. )  AMBULATORY STATUS COMMUNICATION OF NEEDS Skin   Extensive Assist Verbally Normal                       Personal Care Assistance Level of Assistance  Bathing, Feeding, Dressing Bathing Assistance: Limited assistance Feeding assistance: Independent Dressing Assistance: Limited assistance     Functional Limitations Info  Sight, Hearing, Speech Sight Info: Adequate Hearing Info: Adequate Speech Info: Adequate    SPECIAL CARE FACTORS FREQUENCY  PT (By licensed PT), OT (By licensed OT)     PT Frequency: (5) OT Frequency: (5)            Contractures      Additional Factors Info  Code Status, Allergies Code Status Info: (Full Code. ) Allergies Info: (No Known Allergies. )           Current Medications (05/01/2018):  This is the current hospital  active medication list Current Facility-Administered Medications  Medication Dose Route Frequency Provider Last Rate Last Dose  . acetaminophen (TYLENOL) tablet 650 mg  650 mg Oral Q6H PRN Shaune Pollack, MD       Or  . acetaminophen (TYLENOL) suppository 650 mg  650 mg Rectal Q6H PRN Shaune Pollack, MD      . albuterol (PROVENTIL) (2.5 MG/3ML) 0.083% nebulizer solution 2.5 mg  2.5 mg Nebulization Q2H PRN Shaune Pollack, MD      . aspirin chewable tablet 81 mg  81 mg Oral Daily Shaune Pollack, MD   81 mg at 05/01/18 0930  . atorvastatin (LIPITOR) tablet 80 mg  80 mg Oral QHS Shaune Pollack, MD   80 mg at 04/30/18 2224  . azithromycin (ZITHROMAX) 500 mg in sodium chloride 0.9 % 250 mL IVPB  500 mg Intravenous Q24H Jene Every, MD   Stopped at 04/30/18 1450  . bisacodyl (DULCOLAX) EC tablet 5 mg  5 mg Oral Daily PRN Shaune Pollack, MD      . carvedilol (COREG) tablet 1.5625 mg  1.5625 mg Oral BID WC Shaune Pollack, MD      . cefTRIAXone (ROCEPHIN) 2 g in sodium chloride 0.9 % 100 mL IVPB  2 g Intravenous Q24H Jene Every, MD   Stopped at 05/01/18 1001  . clopidogrel (PLAVIX) tablet 75 mg  75 mg Oral QHS Shaune Pollack, MD   75 mg at 04/30/18  2223  . divalproex (DEPAKOTE) DR tablet 250 mg  250 mg Oral Q breakfast Shaune Pollackhen, Qing, MD   250 mg at 05/01/18 0929  . divalproex (DEPAKOTE) DR tablet 500 mg  500 mg Oral QHS Shaune Pollackhen, Qing, MD   500 mg at 04/30/18 2227  . donepezil (ARICEPT) tablet 10 mg  10 mg Oral QHS Shaune Pollackhen, Qing, MD   10 mg at 04/30/18 2227  . enoxaparin (LOVENOX) injection 40 mg  40 mg Subcutaneous Q24H Shaune Pollackhen, Qing, MD   40 mg at 04/30/18 2223  . guaiFENesin (MUCINEX) 12 hr tablet 600 mg  600 mg Oral BID PRN Shaune Pollackhen, Qing, MD      . HYDROcodone-acetaminophen (NORCO/VICODIN) 5-325 MG per tablet 1-2 tablet  1-2 tablet Oral Q4H PRN Shaune Pollackhen, Qing, MD      . insulin aspart (novoLOG) injection 0-5 Units  0-5 Units Subcutaneous QHS Shaune Pollackhen, Qing, MD      . insulin aspart (novoLOG) injection 0-9 Units  0-9 Units Subcutaneous TID WC Shaune Pollackhen,  Qing, MD      . lisinopril (PRINIVIL,ZESTRIL) tablet 5 mg  5 mg Oral Daily Shaune Pollackhen, Qing, MD      . LORazepam (ATIVAN) tablet 0.5 mg  0.5 mg Oral Daily PRN Shaune Pollackhen, Qing, MD      . meclizine (ANTIVERT) tablet 12.5 mg  12.5 mg Oral BID PRN Shaune Pollackhen, Qing, MD      . Dennison BullaLANZapine [facility](ZYPREXA) tablet 2.5 mg  2.5 mg Oral QHS Shaune Pollackhen, Qing, MD   2.5 mg at 04/30/18 2223  . ondansetron (ZOFRAN) tablet 4 mg  4 mg Oral Q6H PRN Shaune Pollackhen, Qing, MD       Or  . ondansetron [facility](ZOFRAN) injection 4 mg  4 mg Intravenous Q6H PRN Shaune Pollackhen, Qing, MD      . senna-docusate (Senokot-S) tablet 1 tablet  1 tablet Oral QHS PRN Shaune Pollackhen, Qing, MD      . tiotropium [facility](SPIRIVA) inhalation capsule 18 mcg  18 mcg Inhalation Dayna BarkerBH-q7a Chen, Qing, MD   18 mcg at 05/01/18 [phone]  . traZODone (DESYREL) tablet 75 mg  75 mg Oral QHS Shaune Pollackhen, Qing, MD   75 mg at 04/30/18 2224     Discharge Medications: Please see discharge summary for a list of discharge medications.  Relevant Imaging Results:  Relevant Lab Results:   Additional Information (SSN: [id number])  Alease Fait, Darleen CrockerBailey M, LCSW

## 2018-05-01 NOTE — Evaluation (Signed)
Physical Therapy Evaluation Patient Details Name: Anthony Thornton MRN: [id number] DOB: 14-Dec-1935 Today's Date: 05/01/2018   History of Present Illness  Pt is an 82 y.o. male presenting to hospital after unwitnessed fall; generalized weakness.  Pt admitted with L sided PNA (CAP with leukocytosis); acute renal failure d/t dehydration, and elevated troponin secondary to demand ischemia.  PMH includes dementia, DM, CVA, htn, pacemaker, PE, AVR, CABG; h/o stroke per MD and nurse.  Clinical Impression  Prior to hospital admission, pt was ambulatory (normally did not use AD; occasional falls).  Pt [facility] at [facility].  Currently pt is 2 assist for transfers (1st transfer pt very unsteady; improved steadiness noted 2nd transfer attempt although still unsteady).  Attempted to ambulate with and without RW but pt very unsteady so deferred ambulation d/t safety concerns.  Pt noted not to use L UE during session unless cued (per pt's facility, this is baseline d/t h/o stroke).  Pt initially set up in chair but as therapist was finishing setting up room to leave, pt noted to be sliding down in chair so pt assisted back to bed for safety.  Pt would benefit from skilled PT to address noted impairments and functional limitations (see below for any additional details).  Upon hospital discharge, d/t pt's unsteadiness and fall risk, currently recommend pt discharge to STR but will monitor pt's status and update discharge recommendations as appropriate.    Follow Up Recommendations SNF    Equipment Recommendations  Rolling walker with 5" wheels    Recommendations for Other Services       Precautions / Restrictions Precautions Precautions: Fall Restrictions Weight Bearing Restrictions: No      Mobility  Bed Mobility Overal bed mobility: Needs Assistance Bed Mobility: Supine to Sit;Sit to Supine     Supine to sit: Mod assist;HOB elevated Sit to supine: Mod assist;HOB elevated   General  bed mobility comments: assist for trunk supine to sit (x2 trials); assist for LE's sit to supine (x2 trials); 2 assist to boost pt up in bed  Transfers Overall transfer level: Needs assistance Equipment used: None Transfers: Sit to/from [employer] Transfers Sit to Stand: Min assist;Mod assist;Min guard;+2 physical assistance Stand pivot transfers: Min guard;Min assist;Mod assist       General transfer comment: 1st trial min to mod assist x2 to stand and takes steps bed to recliner; 2nd trial pt more steady and CGA to min assist x2 to stand and take steps recliner back to bed  Ambulation/Gait Ambulation/Gait assistance: Min assist;Mod assist;+2 physical assistance Gait Distance (Feet): (march in place) Assistive device: Rolling walker (2 wheeled)   Gait velocity: decreased   General Gait Details: attempted marching in place with RW but pt unsteady with and without RW so deferred ambulation d/t safety concerns  Stairs            Wheelchair Mobility    Modified Rankin (Stroke Patients Only)       Balance Overall balance assessment: Needs assistance;History of Falls Sitting-balance support: No upper extremity supported;Feet supported Sitting balance-Leahy Scale: Good Sitting balance - Comments: steady sitting reaching within BOS   Standing balance support: No upper extremity supported Standing balance-Leahy Scale: Poor Standing balance comment: 1st trial min assist to maintain balance; 2nd trial CGA to maintain balance                             Pertinent Vitals/Pain Pain Assessment: No/denies pain  Vitals (HR and O2 on room air) stable and WFL throughout treatment session.    Home Living Family/patient expects to be discharged to:: Skilled nursing facility                 Additional Comments: [facility]    Prior Function Level of Independence: Needs assistance   Gait / Transfers Assistance Needed: Pt ambulatory by self  (tends not to use any AD to walk but occasionally will use RW); h/o occasional falls; if tired will lower self to floor  ADL's / Homemaking Assistance Needed: assist for toileting every 2 hours (otherwise tends to make a "mess") and assist for showers; tends not to use L UE--"holds back" L UE with ambulation and activities; eats independently with R hand  Comments: Above prior function information obtained from [employer]/gallery exhibitions officer at [employer] care); HIPPA maintained during phone call     Hand Dominance   Dominant Hand: Right    Extremity/Trunk Assessment   Upper Extremity Assessment Upper Extremity Assessment: Generalized weakness(L UE AROM shoulder flexion at least 90 degrees; L elbow flexion/extension at least 4/5; good L hand grip; L middle finger PIP joint noted with hyperextension; R UE WFL)    Lower Extremity Assessment Lower Extremity Assessment: Generalized weakness    Cervical / Trunk Assessment Cervical / Trunk Assessment: Normal  Communication   Communication: No difficulties  Cognition Arousal/Alertness: Awake/alert Behavior During Therapy: Flat affect Overall Cognitive Status: (Oriented to person, place, and reports he is here d/t fall; pt reported it was February 1939)                                 General Comments: Increased time to follow multi-step commands      General Comments   Nursing cleared pt for participation in physical therapy.  Pt agreeable to PT session.    Exercises     Assessment/Plan    PT Assessment Patient needs continued PT services  PT Problem List Decreased strength;Decreased balance;Decreased mobility;Decreased knowledge of use of DME;Decreased safety awareness;Decreased knowledge of precautions       PT Treatment Interventions DME instruction;Gait training;Functional mobility training;Therapeutic activities;Therapeutic exercise;Balance training;Patient/family education    PT Goals (Current goals can  be found in the Care Plan section)  Acute Rehab PT Goals Patient Stated Goal: pt did not state goal when asked PT Goal Formulation: With patient Time For Goal Achievement: 05/15/18 Potential to Achieve Goals: Fair    Frequency Min 2X/week   Barriers to discharge Other (comment) Question level of assist available    Co-evaluation               AM-PAC PT "6 Clicks" Daily Activity  Outcome Measure Difficulty turning over in bed (including adjusting bedclothes, sheets and blankets)?: A Little Difficulty moving from lying on back to sitting on the side of the bed? : Unable Difficulty sitting down on and standing up from a chair with arms (e.g., wheelchair, bedside commode, etc,.)?: Unable Help needed moving to and from a bed to chair (including a wheelchair)?: Total Help needed walking in hospital room?: Total Help needed climbing 3-5 steps with a railing? : Total 6 Click Score: 8    End of Session Equipment Utilized During Treatment: Gait belt Activity Tolerance: Patient tolerated treatment well Patient left: in bed;with call bell/phone within reach;with bed alarm set;with nursing/sitter in room;Other (comment)(B heels elevated via pillow; nursing present who reported  she would lower bed once she was done giving medications; 2 fall floor mats in place) Nurse Communication: Mobility status;Precautions PT Visit Diagnosis: Unsteadiness on feet (R26.81);Muscle weakness (generalized) (M62.81);Repeated falls (R29.6);History of falling (Z91.81);Difficulty in walking, not elsewhere classified (R26.2)    Time: [id number] PT Time Calculation (min) (ACUTE ONLY): 42 min   Charges:   PT Evaluation $PT Eval Low Complexity: 1 Low PT Treatments $Therapeutic Activity: 23-37 mins   PT G CodesHendricks Limes:       Suraj Ramdass, PT 05/01/18, 10:13 AM [phone]

## 2018-05-02 LAB — BASIC METABOLIC PANEL
ANION GAP: 7 (ref 5–15)
BUN: 16 mg/dL (ref 8–23)
CO2: 25 mmol/L (ref 22–32)
Calcium: 8.6 mg/dL — ABNORMAL LOW (ref 8.9–10.3)
Chloride: 104 mmol/L (ref 98–111)
Creatinine, Ser: 0.7 mg/dL (ref 0.61–1.24)
GFR calc Af Amer: >60 mL/min (ref >60)
GLUCOSE: 86 mg/dL (ref 70–99)
POTASSIUM: 4.5 mmol/L (ref 3.5–5.1)
Sodium: 136 mmol/L (ref 135–145)

## 2018-05-02 LAB — GLUCOSE, CAPILLARY
GLUCOSE-CAPILLARY: 75 mg/dL (ref 70–99)
Glucose-Capillary: 55 mg/dL — ABNORMAL LOW (ref 70–99)
Glucose-Capillary: 98 mg/dL (ref 70–99)

## 2018-05-02 LAB — LEGIONELLA PNEUMOPHILA SEROGP 1 UR AG: L. PNEUMOPHILA SEROGP 1 UR AG: NEGATIVE

## 2018-05-02 LAB — TROPONIN I: Troponin I: 0.23 ng/mL (ref <0.03)

## 2018-05-02 MED ORDER — CEFUROXIME AXETIL 500 MG PO TABS: 500.0000 mg | ORAL_TABLET | Freq: Two times a day (BID) | ORAL | 0 refills | Status: DC

## 2018-05-02 MED ORDER — GUAIFENESIN ER 600 MG PO TB12
600.0000 mg | ORAL_TABLET | Freq: Two times a day (BID) | ORAL | Status: AC | PRN
Start: 2018-05-02 — End: 2019-05-02

## 2018-05-02 MED ORDER — AZITHROMYCIN 500 MG PO TABS: 500.0000 mg | ORAL_TABLET | Freq: Every day | ORAL | 0 refills | Status: AC

## 2018-05-02 MED ORDER — LORAZEPAM 1 MG PO TABS: 0.5000 mg | ORAL_TABLET | Freq: Every day | ORAL | 0 refills | Status: AC | PRN

## 2018-05-02 NOTE — Discharge Summary (Addendum)
[facility] [place] [facility]   PATIENT NAME: Anthony Thornton    MR#:  [id number]  DATE OF BIRTH:  09/27/1936  DATE OF ADMISSION:  04/30/2018 ADMITTING PHYSICIAN: Shaune PollackQing Chen, MD  DATE OF DISCHARGE: 05/02/2018  PRIMARY CARE PHYSICIAN: Mick SellFitzgerald, David P, MD    ADMISSION DIAGNOSIS:  Elevated troponin [R74.8] Community acquired pneumonia of left lower lobe of lung (HCC) [J18.1]  DISCHARGE DIAGNOSIS:  Active Problems:   Pneumonia   SECONDARY DIAGNOSIS:   Past Medical History:  Diagnosis Date  . Adjustment disorder with anxiety   . CVA (cerebral vascular accident) (HCC)   . Dementia   . Diabetes mellitus without complication (HCC)   . Glaucoma   . Hypercholesteremia   . Hypertension   . Pacemaker   . PE (pulmonary thromboembolism) [facility](HCC)     HOSPITAL COURSE:  82 year old male with history of dementia, CVA and diabetes who presents to the emergency room after unwitnessed fall.  1.  Sepsis with tachypnea and leukocytosis due to left long pneumonia: Blood cultures are negative. He will be discharged on Ceftin for 6 days and Azithromycin for 3 days.  2.  Acute kidney injury in the setting of sepsis: This is improved with IV fluids  3.  Elevated troponin: This is due to demand ischemia.He has been ruled out for ACS.  4.  Dementia: Continue Aricept  5.  History of CVA: Continue Plavix and aspirin and statin  6. Hx of Diabetes on ADA diet   Outpatient palliative care  DISCHARGE CONDITIONS AND DIET:   Stable Regular diabetic  diet  CONSULTS OBTAINED:    DRUG ALLERGIES:  No Known Allergies  DISCHARGE MEDICATIONS:   Allergies as of 05/02/2018   No Known Allergies     Medication List    STOP taking these medications   ondansetron 4 MG disintegrating tablet Commonly known as:  ZOFRAN ODT     TAKE these medications   albuterol 108 (90 Base) MCG/ACT inhaler Commonly known as:  PROVENTIL HFA;VENTOLIN HFA Inhale 2 puffs into the lungs  4 (four) times daily as needed for wheezing. What changed:  Another medication with the same name was removed. Continue taking this medication, and follow the directions you see here.   aspirin 81 MG chewable tablet Chew 81 mg by mouth daily.   atorvastatin 80 MG tablet Commonly known as:  LIPITOR Take 80 mg by mouth at bedtime.   azithromycin 500 MG tablet Commonly known as:  ZITHROMAX Take 1 tablet (500 mg total) by mouth daily for 3 days. Take 1 tablet daily for 3 days.   carvedilol 3.125 MG tablet Commonly known as:  COREG Take 1.5625 mg by mouth 2 (two) times daily with a meal.   cefUROXime 500 MG tablet Commonly known as:  CEFTIN Take 1 tablet (500 mg total) by mouth 2 (two) times daily with a meal.   clopidogrel 75 MG tablet Commonly known as:  PLAVIX Take 75 mg by mouth at bedtime.   divalproex 250 MG DR tablet Commonly known as:  DEPAKOTE Take 250-500 mg by mouth 2 (two) times daily. Take 1 tablet (250MG ) by mouth every morning and 2 tablets (500MG ) by mouth every night   donepezil 10 MG tablet Commonly known as:  ARICEPT Take 10 mg by mouth at bedtime.   furosemide 20 MG tablet Commonly known as:  LASIX Take 20 mg by mouth daily.   guaiFENesin 600 MG 12 hr tablet Commonly known as:  MUCINEX Take 1 tablet (  600 mg total) by mouth 2 (two) times daily as needed for to loosen phlegm.   lisinopril 5 MG tablet Commonly known as:  PRINIVIL,ZESTRIL Take 5 mg by mouth daily.   LORazepam 1 MG tablet Commonly known as:  ATIVAN Take 0.5 tablets (0.5 mg total) by mouth daily as needed (agitation).   meclizine 12.5 MG tablet Commonly known as:  ANTIVERT Take 12.5 mg by mouth 2 (two) times daily as needed for nausea.   OLANZapine 2.5 MG tablet Commonly known as:  ZYPREXA Take 2.5 mg by mouth at bedtime.   Tiotropium Bromide Monohydrate 1.25 MCG/ACT Aers Inhale 1.25 mcg into the lungs every morning.   traZODone 50 MG tablet Commonly known as:  DESYREL Take 75  mg by mouth at bedtime.         Today   CHIEF COMPLAINT:  No issues overnight   VITAL SIGNS:  Blood pressure (!) 138/56, pulse (!) 51, temperature 98 F (36.7 C), resp. rate 18, height 6' (1.829 m), weight 90.7 kg (200 lb), SpO2 98 %.   REVIEW OF SYSTEMS:  Unable to obtain dementia   PHYSICAL EXAMINATION:  GENERAL:  82 y.o.-year-old patient lying in the bed with no acute distress.  NECK:  Supple, no jugular venous distention. No thyroid enlargement, no tenderness.  LUNGS: Normal breath sounds bilaterally, no wheezing, rales,rhonchi  No use of accessory muscles of respiration.  CARDIOVASCULAR: S1, S2 normal. No murmurs, rubs, or gallops.  ABDOMEN: Soft, non-tender, non-distended. Bowel sounds present. No organomegaly or mass.  EXTREMITIES: No pedal edema, cyanosis, or clubbing.  PSYCHIATRIC: The patient is alert and oriented x name only  SKIN: No obvious rash, lesion, or ulcer.   DATA REVIEW:   CBC Recent Labs  Lab 05/01/18 0323  WBC 19.4*  HGB 12.0*  HCT 35.2*  PLT 138*    Chemistries  Recent Labs  Lab 04/30/18 0838  05/02/18 0329  NA 137   < > 136  K 3.8   < > 4.5  CL 103   < > 104  CO2 21*   < > 25  GLUCOSE 128*   < > 86  BUN 22*   < > 16  CREATININE 1.50*   < > 0.70  CALCIUM 9.1   < > 8.6*  AST 117*  --   --   ALT 107*  --   --   ALKPHOS 68  --   --   BILITOT 0.8  --   --    < > = values in this interval not displayed.    Cardiac Enzymes Recent Labs  Lab 05/01/18 1041 05/01/18 1633 05/01/18 2318  TROPONINI 0.22* 0.30* 0.23*    Microbiology Results  @MICRORSLT48 @  RADIOLOGY:  No results found.    Allergies as of 05/02/2018   No Known Allergies     Medication List    STOP taking these medications   ondansetron 4 MG disintegrating tablet Commonly known as:  ZOFRAN ODT     TAKE these medications   albuterol 108 (90 Base) MCG/ACT inhaler Commonly known as:  PROVENTIL HFA;VENTOLIN HFA Inhale 2 puffs into the lungs 4 (four)  times daily as needed for wheezing. What changed:  Another medication with the same name was removed. Continue taking this medication, and follow the directions you see here.   aspirin 81 MG chewable tablet Chew 81 mg by mouth daily.   atorvastatin 80 MG tablet Commonly known as:  LIPITOR Take 80 mg by mouth at bedtime.  azithromycin 500 MG tablet Commonly known as:  ZITHROMAX Take 1 tablet (500 mg total) by mouth daily for 3 days. Take 1 tablet daily for 3 days.   carvedilol 3.125 MG tablet Commonly known as:  COREG Take 1.5625 mg by mouth 2 (two) times daily with a meal.   cefUROXime 500 MG tablet Commonly known as:  CEFTIN Take 1 tablet (500 mg total) by mouth 2 (two) times daily with a meal.   clopidogrel 75 MG tablet Commonly known as:  PLAVIX Take 75 mg by mouth at bedtime.   divalproex 250 MG DR tablet Commonly known as:  DEPAKOTE Take 250-500 mg by mouth 2 (two) times daily. Take 1 tablet (250MG ) by mouth every morning and 2 tablets (500MG ) by mouth every night   donepezil 10 MG tablet Commonly known as:  ARICEPT Take 10 mg by mouth at bedtime.   furosemide 20 MG tablet Commonly known as:  LASIX Take 20 mg by mouth daily.   guaiFENesin 600 MG 12 hr tablet Commonly known as:  MUCINEX Take 1 tablet (600 mg total) by mouth 2 (two) times daily as needed for to loosen phlegm.   lisinopril 5 MG tablet Commonly known as:  PRINIVIL,ZESTRIL Take 5 mg by mouth daily.   LORazepam 1 MG tablet Commonly known as:  ATIVAN Take 0.5 tablets (0.5 mg total) by mouth daily as needed (agitation).   meclizine 12.5 MG tablet Commonly known as:  ANTIVERT Take 12.5 mg by mouth 2 (two) times daily as needed for nausea.   OLANZapine 2.5 MG tablet Commonly known as:  ZYPREXA Take 2.5 mg by mouth at bedtime.   Tiotropium Bromide Monohydrate 1.25 MCG/ACT Aers Inhale 1.25 mcg into the lungs every morning.   traZODone 50 MG tablet Commonly known as:  DESYREL Take 75 mg by  mouth at bedtime.         Management plans discussed with the patient and he is in agreement. Stable for discharge   Patient should follow up with pcp  CODE STATUS:     Code Status Orders  (From admission, onward)        Start     Ordered   04/30/18 1149  Full code  Continuous     04/30/18 1148    Code Status History    Date Active Date Inactive Code Status Order ID Comments User Context   02/20/2018 1145 02/23/2018 1941 Full Code [id number]  Milagros Loll, MD ED      TOTAL TIME TAKING CARE OF THIS PATIENT: 37 minutes.    Note: This dictation was prepared with Dragon dictation along with smaller phrase technology. Any transcriptional errors that result from this process are unintentional.  Afton Lavalle M.D on 05/02/2018 at 11:22 AM  Between 7am to 6pm - Pager - [phone] After 6pm go to [email or web address] - Social research officer, government  [facility]  Office  [phone]  CC: Primary care physician; Mick Sell, MD

## 2018-05-02 NOTE — Progress Notes (Signed)
Report called to [facility] RN. Pt is in no acute distress. VSS. IV removed. EMS called to transport the pt

## 2018-05-02 NOTE — Care Management Note (Signed)
Case Management Note  Patient Details  Name: Anthony Thornton MRN: [id number] Date of Birth: 01/02/1936  Subjective/Objective:   Referral received from CSW that patient is discharging to [facility] today and will need RN, PT, OT and Sw. Family is agreeable to home health with no agency preference. [facility] uses [facility]. [facility] . Discharging today. No DME needs                Action/Plan:   Expected Discharge Date:  05/02/18               Expected Discharge Plan:  Home w Home Health Services  In-House Referral:     Discharge planning Services  CM Consult  Post Acute Care Choice:  Home Health Choice offered to:  Adult Children  DME Arranged:    DME Agency:     HH Arranged:  RN, PT, OT, SW HH Agency:  [facility]  Status of Service:  Completed, signed off  If discussed at Long Length of Stay Meetings, dates discussed:    Additional Comments:  Anthony MemosLisa M Wateen Varon, RN 05/02/2018, 11:43 AM

## 2018-05-02 NOTE — Progress Notes (Addendum)
Per [employer]Doug staff member at [facility] he came to see patient yesterday and stated that [facility] can [facility]. RN case manager made home health arrangements. Patient's wife Consuella Loselaine is agreeable for patient to D/C back to ALF today. RN will call report and EMS for transport. Clinical Child psychotherapistocial Worker (CSW) sent D/C orders to [facility]. Please reconsult if future social work needs arise. CSW signing off.   Gala RomneyDoug called CSW back and stated that [facility] is the preferred home health agency. RN case manager aware of above.   Baker Hughes IncorporatedBailey Elcie Pelster, LCSW [phone]

## 2018-05-02 NOTE — Care Management (Addendum)
RNCM notified by floor RN that she had talked with executive leader Ivar Drape(Cindy Boone) at [facility] [phone]. Per previous RNCM note, patient has already been arranged with [facility] home health "facility preference" and family didn't have a preference. CSW spoke with Gala Romneyoug at [facility] and [facility]. Update at 1512: RNCM received notification from CSW that update from [facility] that [facility] is preferred provider.  RNCM spoke with Ivar Drapeindy Boone and explained previous CM arrangements- she seemed to become agitated and stated that her friend with [facility]/Rep, can provide the services needed Rebecca Eaton([place]). Cindy then begin to be more rationale stating that their in-house PT group "is in contract with [facility] and one can bill Medicare A and the other Medicare B".  RNCM has cancelled home health with apologies to [facility]. Rosey Batheresa with [facility] notified of patient discharge. Attempted to update Wife on home health agency change per [facility] but no answer.

## 2018-05-02 NOTE — [place] FL2 (Signed)
[place] MEDICAID FL2 LEVEL OF CARE SCREENING TOOL     IDENTIFICATION  Patient Name: Anthony Thornton Birthdate: 03/03/1936 Sex: male Admission Date (Current Location): 04/30/2018  [place]  [place] ([id number]) Facility and Address:  [facility], [address], [place], [place] [address]      Provider Number: [id number]  Attending Physician Name and Address:  Adrian SaranMody, Sital, MD  Relative Name and Phone Number:       Current Level of Care: Hospital Recommended Level of Care: Assisted Living Facility  Memory Care  Prior Approval Number:    Date Approved/Denied:   PASRR Number:    Discharge Plan: Assisted Living Facility  Memory Care    Current Diagnoses: Primary: Dementia Patient Active Problem List   Diagnosis Date Noted  . Pneumonia 04/30/2018  . Acute bronchitis 02/20/2018  . 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 Vascular dementia 08/23/2017    Orientation RESPIRATION BLADDER Height & Weight     Self  Normal Incontinent Weight: 200 lb (90.7 kg) Height:  6' (182.9 cm)  BEHAVIORAL SYMPTOMS/MOOD NEUROLOGICAL BOWEL NUTRITION STATUS     X1 Continent Diet(Diet: Heart Healthy/ Carb Modified. )  AMBULATORY STATUS COMMUNICATION OF NEEDS Skin   Limited Assistance  Verbally Normal                       Personal Care Assistance Level of Assistance  Bathing, Feeding, Dressing Bathing Assistance: Limited assistance Feeding assistance: Independent Dressing Assistance: Limited assistance     Functional Limitations Info  Sight, Hearing, Speech Sight Info: Adequate Hearing Info: Adequate Speech Info: Adequate    SPECIAL CARE FACTORS FREQUENCY  Home Health PT 2-3                   Contractures      Additional Factors Info  Code Status, Allergies Code Status Info: (Full Code. ) Allergies Info: (No Known Allergies. )          Discharge Medications: Please see discharge  summary for a list of discharge medications. Medication List    STOP taking these medications   ondansetron 4 MG disintegrating tablet Commonly known as:  ZOFRAN ODT     TAKE these medications   albuterol 108 (90 Base) MCG/ACT inhaler Commonly known as:  PROVENTIL HFA;VENTOLIN HFA Inhale 2 puffs into the lungs 4 (four) times daily as needed for wheezing. What changed:  Another medication with the same name was removed. Continue taking this medication, and follow the directions you see here.   aspirin 81 MG chewable tablet Chew 81 mg by mouth daily.   atorvastatin 80 MG tablet Commonly known as:  LIPITOR Take 80 mg by mouth at bedtime.   azithromycin 500 MG tablet Commonly known as:  ZITHROMAX Take 1 tablet (500 mg total) by mouth daily for 3 days. Take 1 tablet daily for 3 days.   carvedilol 3.125 MG tablet Commonly known as:  COREG Take 1.5625 mg by mouth 2 (two) times daily with a meal.   cefUROXime 500 MG tablet Commonly known as:  CEFTIN Take 1 tablet (500 mg total) by mouth 2 (two) times daily with a meal.   clopidogrel 75 MG tablet Commonly known as:  PLAVIX Take 75 mg by mouth at bedtime.   divalproex 250 MG DR tablet Commonly known as:  DEPAKOTE Take 250-500 mg by mouth 2 (two) times daily. Take 1 tablet (250MG ) by mouth every morning and  2 tablets (500MG ) by mouth every night   donepezil 10 MG tablet Commonly known as:  ARICEPT Take 10 mg by mouth at bedtime.   furosemide 20 MG tablet Commonly known as:  LASIX Take 20 mg by mouth daily.   guaiFENesin 600 MG 12 hr tablet Commonly known as:  MUCINEX Take 1 tablet (600 mg total) by mouth 2 (two) times daily as needed for to loosen phlegm.   lisinopril 5 MG tablet Commonly known as:  PRINIVIL,ZESTRIL Take 5 mg by mouth daily.   LORazepam 1 MG tablet Commonly known as:  ATIVAN Take 0.5 tablets (0.5 mg total) by mouth daily as needed (agitation).   meclizine 12.5 MG tablet Commonly  known as:  ANTIVERT Take 12.5 mg by mouth 2 (two) times daily as needed for nausea.   OLANZapine 2.5 MG tablet Commonly known as:  ZYPREXA Take 2.5 mg by mouth at bedtime.   Tiotropium Bromide Monohydrate 1.25 MCG/ACT Aers Inhale 1.25 mcg into the lungs every morning.   traZODone 50 MG tablet Commonly known as:  DESYREL Take 75 mg by mouth at bedtime.     Relevant Imaging Results: Relevant Lab Results: Additional Information (SSN: [id number])  Lachrisha Ziebarth, Darleen Crocker, LCSW

## 2018-05-04 ENCOUNTER — Telehealth: Payer: Self-pay

## 2018-05-04 NOTE — Telephone Encounter (Signed)
EMMI Follow-up: Received a call from patients wife, Renold Donlaine Feltner. She said she had received call from my number and returning to see why we had called. I explained the two automated calls post discharge and asked her how things were gong for her husband. Said she was just visiting with him at the facility [facility] doing okay and not coughing as much.  She is aware of follow-up appointments. I asked her if she would to be removed from the next call since her husband is in the facility but she said to let the system call in case she has any questions.  No needs noted today.

## 2018-05-05 ENCOUNTER — Telehealth: Payer: Self-pay

## 2018-05-05 LAB — CULTURE, BLOOD (ROUTINE X 2)
Culture: NO GROWTH
Culture: NO GROWTH
Special Requests: ADEQUATE
Special Requests: ADEQUATE

## 2018-05-05 NOTE — Telephone Encounter (Signed)
EMMI Follow-up: Anthony Thornton had received another call and it was noted on the report she had some other questions and about discharge papers.  When 1st call was received she didn't get to the phone in time and that prompted the system to make another call even though she had called my number back. I talked with Anthony Thornton and said she couldn't explain to the person (EMMI System) and wanted to make sure she understood her husband had a UTI but got that answered at the facility where he is at. I let her know she would receive another automated call with a different series of questions. No needs noted today.

## 2018-05-14 ENCOUNTER — Emergency Department: Payer: Medicare Other

## 2018-05-14 ENCOUNTER — Emergency Department
Admission: EM | Admit: 2018-05-14 | Discharge: 2018-05-14 | Disposition: A | Discharge status: Home / Self Care | Payer: Medicare Other | Attending: Emergency Medicine | Admitting: Emergency Medicine

## 2018-05-14 ENCOUNTER — Other Ambulatory Visit: Payer: Self-pay

## 2018-05-14 ENCOUNTER — Encounter: Payer: Self-pay | Admitting: Emergency Medicine

## 2018-05-14 DIAGNOSIS — R569 Unspecified convulsions: Secondary | ICD-10-CM

## 2018-05-14 DIAGNOSIS — Z7902 Long term (current) use of antithrombotics/antiplatelets: Secondary | ICD-10-CM | POA: Diagnosis not present

## 2018-05-14 DIAGNOSIS — Z79899 Other long term (current) drug therapy: Secondary | ICD-10-CM | POA: Insufficient documentation

## 2018-05-14 DIAGNOSIS — Z7982 Long term (current) use of aspirin: Secondary | ICD-10-CM | POA: Diagnosis not present

## 2018-05-14 DIAGNOSIS — G40909 Epilepsy, unspecified, not intractable, without status epilepticus: Secondary | ICD-10-CM

## 2018-05-14 DIAGNOSIS — E119 Type 2 diabetes mellitus without complications: Secondary | ICD-10-CM | POA: Diagnosis not present

## 2018-05-14 DIAGNOSIS — I10 Essential (primary) hypertension: Secondary | ICD-10-CM | POA: Insufficient documentation

## 2018-05-14 DIAGNOSIS — Z87891 Personal history of nicotine dependence: Secondary | ICD-10-CM | POA: Diagnosis not present

## 2018-05-14 DIAGNOSIS — F0151 Vascular dementia with behavioral disturbance: Secondary | ICD-10-CM | POA: Diagnosis not present

## 2018-05-14 HISTORY — DX: Unspecified convulsions: R56.9

## 2018-05-14 LAB — URINALYSIS, COMPLETE (UACMP) WITH MICROSCOPIC
BILIRUBIN URINE: NEGATIVE
GLUCOSE, UA: NEGATIVE mg/dL
Hgb urine dipstick: NEGATIVE
KETONES UR: 5 mg/dL — AB
LEUKOCYTES UA: NEGATIVE
NITRITE: NEGATIVE
PROTEIN: NEGATIVE mg/dL
Specific Gravity, Urine: 1.019 (ref 1.005–1.030)
pH: 6 (ref 5.0–8.0)

## 2018-05-14 LAB — CBC WITH DIFFERENTIAL/PLATELET
Basophils Absolute: 0 10*3/uL (ref 0–0.1)
Basophils Relative: 1 %
EOS PCT: 1 %
Eosinophils Absolute: 0.1 10*3/uL (ref 0–0.7)
HEMATOCRIT: 38.5 % — AB (ref 40.0–52.0)
HEMOGLOBIN: 13 g/dL (ref 13.0–18.0)
LYMPHS ABS: 1.7 10*3/uL (ref 1.0–3.6)
LYMPHS PCT: 32 %
MCH: 29.9 pg (ref 26.0–34.0)
MCHC: 33.7 g/dL (ref 32.0–36.0)
MCV: 88.9 fL (ref 80.0–100.0)
MONOS PCT: 9 %
Monocytes Absolute: 0.5 10*3/uL (ref 0.2–1.0)
NEUTROS PCT: 57 %
Neutro Abs: 3.1 10*3/uL (ref 1.4–6.5)
Platelets: 151 10*3/uL (ref 150–440)
RBC: 4.33 MIL/uL — AB (ref 4.40–5.90)
RDW: 14.9 % — ABNORMAL HIGH (ref 11.5–14.5)
WBC: 5.4 10*3/uL (ref 3.8–10.6)

## 2018-05-14 LAB — BASIC METABOLIC PANEL
Anion gap: 7 (ref 5–15)
BUN: 12 mg/dL (ref 8–23)
CO2: 27 mmol/L (ref 22–32)
CREATININE: 0.91 mg/dL (ref 0.61–1.24)
Calcium: 8.6 mg/dL — ABNORMAL LOW (ref 8.9–10.3)
Chloride: 101 mmol/L (ref 98–111)
GFR calc Af Amer: >60 mL/min (ref >60)
GFR calc non Af Amer: >60 mL/min (ref >60)
Glucose, Bld: 126 mg/dL — ABNORMAL HIGH (ref 70–99)
POTASSIUM: 4.5 mmol/L (ref 3.5–5.1)
SODIUM: 135 mmol/L (ref 135–145)

## 2018-05-14 LAB — VALPROIC ACID LEVEL: Valproic Acid Lvl: 50 ug/mL (ref 50.0–100.0)

## 2018-05-14 NOTE — ED Notes (Signed)
Peripheral IV discontinued. Catheter intact. No signs of infiltration or redness. Gauze applied to IV site.   

## 2018-05-14 NOTE — ED Triage Notes (Signed)
Pt arrives via [facility] with c/o staff witnessing x2 "petite" seizures. [facility]. Pt is A/O at baseline upon arrival to ED. Per EMS, BP 151/85, HR 63, and was 100% RA and CBG 103. Pt is in NAD at this time.

## 2018-05-14 NOTE — ED Notes (Addendum)
Pt left with EMS att, pt unable to sign att, [place] notified and report and VS given to EMS

## 2018-05-14 NOTE — Discharge Instructions (Signed)
Your CT scan of the head, chest x-ray, and lab test today were all okay.  Follow-up with your doctor for continued monitoring of your symptoms.  Be sure to take all of your medications as usual including your Depakote.

## 2018-05-14 NOTE — ED Provider Notes (Signed)
[facility] Emergency Department Provider Note  ____________________________________________  Time seen: Approximately 8:35 PM  I have reviewed the triage vital signs and the nursing notes.   HISTORY  Chief Complaint Seizures  Level 5 Caveat: Portions of the History and Physical including HPI and review of systems are unable to be completely obtained due to patient being a poor historian   HPI Anthony Thornton is a 82 y.o. male with a history of diabetes hypertension seizures stroke and dementia who was sent to the ED today due to  having 2 episodes of focal seizure consisting of staring episode.  No recent trauma, compliant with medications.  Patient denies any complaints and reports that he currently feels fine.  EMS reports seizures happened at 5:30 PM today and 6:30 PM today and in between the patient ate dinner.  No other reported symptoms.  Patient denies any pain complaints.     Past Medical History:  Diagnosis Date  . Adjustment disorder with anxiety   . CVA (cerebral vascular accident) (HCC)   . Dementia   . Diabetes mellitus without complication (HCC)   . Glaucoma   . Hypercholesteremia   . Hypertension   . Pacemaker   . PE (pulmonary thromboembolism) (HCC)   . Seizures [facility])      Patient Active Problem List   Diagnosis Date Noted  . Pneumonia 04/30/2018  . Acute bronchitis 02/20/2018  . 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 Vascular dementia 08/23/2017     Past Surgical History:  Procedure Laterality Date  . AORTIC VALVE REPLACEMENT (AVR)/CORONARY ARTERY BYPASS GRAFTING (CABG)       Prior to Admission medications   Medication Sig Start Date End Date Taking? Authorizing Provider  albuterol (PROVENTIL HFA;VENTOLIN HFA) 108 (90 Base) MCG/ACT inhaler Inhale 2 puffs into the lungs 4 (four) times daily as needed for wheezing.     [provider]  aspirin 81 MG chewable tablet Chew 81 mg by mouth  daily.    [provider]  atorvastatin (LIPITOR) 80 MG tablet Take 80 mg by mouth at bedtime.     [provider]  carvedilol (COREG) 3.125 MG tablet Take 1.5625 mg by mouth 2 (two) times daily with a meal.     [provider]  cefUROXime (CEFTIN) 500 MG tablet Take 1 tablet (500 mg total) by mouth 2 (two) times daily with a meal. 05/02/18   Adrian Saran, MD  clopidogrel (PLAVIX) 75 MG tablet Take 75 mg by mouth at bedtime.     [provider]  divalproex (DEPAKOTE) 250 MG DR tablet Take 250-500 mg by mouth 2 (two) times daily. Take 1 tablet (250MG ) by mouth every morning and 2 tablets (500MG ) by mouth every night    [provider]  donepezil (ARICEPT) 10 MG tablet Take 10 mg by mouth at bedtime.     [provider]  furosemide (LASIX) 20 MG tablet Take 20 mg by mouth daily. 10/28/16   [provider]  guaiFENesin (MUCINEX) 600 MG 12 hr tablet Take 1 tablet (600 mg total) by mouth 2 (two) times daily as needed for to loosen phlegm. 05/02/18 05/02/19  Adrian Saran, MD  lisinopril (PRINIVIL,ZESTRIL) 5 MG tablet Take 5 mg by mouth daily. 11/29/16   [provider]  LORazepam (ATIVAN) 1 MG tablet Take 0.5 tablets (0.5 mg total) by mouth daily as needed (agitation). 05/02/18   Adrian Saran, MD  meclizine (ANTIVERT) 12.5 MG tablet Take 12.5  mg by mouth 2 (two) times daily as needed for nausea.     [provider]  OLANZapine (ZYPREXA) 2.5 MG tablet Take 2.5 mg by mouth at bedtime.     [provider]  Tiotropium Bromide Monohydrate 1.25 MCG/ACT AERS Inhale 1.25 mcg into the lungs every morning.     [provider]  traZODone (DESYREL) 50 MG tablet Take 75 mg by mouth at bedtime.     [provider]     Allergies Patient has no known allergies.   No family history on file.  Social History Social History   Tobacco Use  . Smoking status: Former Games developer  . Smokeless tobacco: Never Used  Substance  Use Topics  . Alcohol use: No  . Drug use: No    Review of Systems Level 5 Caveat: Portions of the History and Physical including HPI and review of systems are unable to be completely obtained due to patient being a poor historian related to chronic dementia  ____________________________________________   PHYSICAL EXAM:  VITAL SIGNS: ED Triage Vitals  Enc Vitals Group     BP 05/14/18 1930 (!) 114/59     Pulse Rate 05/14/18 1930 60     Resp 05/14/18 1930 (!) 22     Temp 05/14/18 1930 97.8 F (36.6 C)     Temp Source 05/14/18 1930 Oral     SpO2 05/14/18 1930 98 %     Weight 05/14/18 1919 200 lb (90.7 kg)     Height 05/14/18 1919 6' (1.829 m)     Head Circumference --      Peak Flow --      Pain Score --      Pain Loc --      Pain Edu? --      Excl. in GC? --     Vital signs reviewed, nursing assessments reviewed.   Constitutional:   Alert and oriented. Non-toxic appearance. Eyes:   Conjunctivae are normal. EOMI. PERRL. ENT      Head:   Normocephalic and atraumatic.      Nose:   No congestion/rhinnorhea.       Mouth/Throat:   MMM, no pharyngeal erythema. No peritonsillar mass.       Neck:   No meningismus. Full ROM. Hematological/Lymphatic/Immunilogical:   No cervical lymphadenopathy. Cardiovascular:   RRR. Symmetric bilateral radial and DP pulses.  No murmurs.  Respiratory:   Normal respiratory effort without tachypnea/retractions. Breath sounds are clear and equal bilaterally. No wheezes/rales/rhonchi.  Congested cough Gastrointestinal:   Soft and nontender. Non distended. There is no CVA tenderness.  No rebound, rigidity, or guarding. Genitourinary:   Normal Musculoskeletal:   Normal range of motion in all extremities. No joint effusions.  No lower extremity tenderness.  No edema. Neurologic:   Normal speech and language.  Motor grossly intact. No acute focal neurologic deficits are appreciated.  Skin:    Skin is warm, dry and intact. No rash noted.  No petechiae,  purpura, or bullae.  ____________________________________________    LABS (pertinent positives/negatives) (all labs ordered are listed, but only abnormal results are displayed) Labs Reviewed  BASIC METABOLIC PANEL - Abnormal; Notable for the following components:      Result Value   Glucose, Bld 126 (*)    Calcium 8.6 (*)    All other components within normal limits  CBC WITH DIFFERENTIAL/PLATELET - Abnormal; Notable for the following components:   RBC 4.33 (*)    HCT 38.5 (*)    RDW  14.9 (*)    All other components within normal limits  URINALYSIS, COMPLETE (UACMP) WITH MICROSCOPIC - Abnormal; Notable for the following components:   Color, Urine YELLOW (*)    APPearance CLEAR (*)    Ketones, ur 5 (*)    Bacteria, UA RARE (*)    All other components within normal limits  VALPROIC ACID LEVEL   ____________________________________________   EKG  Interpreted by me Sinus rhythm rate of 71, left axis, normal intervals.  Right bundle branch block.  No acute ischemic changes.  ____________________________________________    RADIOLOGY  Dg Chest 2 View  Result Date: 05/14/2018 CLINICAL DATA:  Witnessed seizure x 2 tonight, cough, history diabetes mellitus, hypertension pulmonary embolism in former smoker, prior stroke EXAM: CHEST - 2 VIEW COMPARISON:  04/30/2018 FINDINGS: Normal heart size post median sternotomy and CABG. Atherosclerotic calcification aorta. Mediastinal contours and pulmonary vascularity normal. Lungs clear. No pulmonary infiltrate, pleural effusion or pneumothorax. Osseous structures unremarkable. Slight gaseous distention of the gastric fundus. IMPRESSION: No acute abnormalities. Electronically Signed   By: Ulyses SouthwardMark  Boles M.D.   On: 05/14/2018 20:26    ____________________________________________   PROCEDURES Procedures  ____________________________________________  DIFFERENTIAL DIAGNOSIS   Epilepsy, metabolic derangement, urinary tract infection,  pneumonia, recurrent subdural hematoma  CLINICAL IMPRESSION / ASSESSMENT AND PLAN / ED COURSE  Pertinent labs & imaging results that were available during my care of the patient were reviewed by me and considered in my medical decision making (see chart for details).    Patient is nontoxic, vital signs are unremarkable, currently awake and alert and asymptomatic.  Due to reported seizures, labs urinalysis chest x-ray obtained.  These were all unremarkable, no evidence of pneumothorax or pneumonia on chest x-ray.  Labs unremarkable.  CT of the head is negative for intracranial hemorrhage such as subdural hematoma.  Depakote level is therapeutic, slightly better than usual.  I will give additional Depakote tonight, plan to discharge home to follow-up with primary care.  No acute needs at this time.  Not consistent with status epilepticus, no evidence of acute infectious process or electrolyte abnormality.  Patient is very calm and comfortable appearing  Clinical Course as of May 14 2034  Wynelle LinkSun May 14, 2018  2017 Hgb urine dipstick: NEGATIVE [PS]    Clinical Course User Index [PS] Sharman CheekStafford, Niranjan Rufener, MD     ____________________________________________   FINAL CLINICAL IMPRESSION(S) / ED DIAGNOSES    Final diagnoses:  Seizure-like activity [facility](HCC)  Seizure disorder [facility](HCC)     ED Discharge Orders    None      Portions of this note were generated with dragon dictation software. Dictation errors may occur despite best attempts at proofreading.    Sharman CheekStafford, Charlii Yost, MD 05/14/18 2044

## 2018-05-26 ENCOUNTER — Other Ambulatory Visit: Payer: Self-pay

## 2018-05-26 ENCOUNTER — Ambulatory Visit (INDEPENDENT_AMBULATORY_CARE_PROVIDER_SITE_OTHER): Payer: Medicare Other | Admitting: Urology

## 2018-05-26 ENCOUNTER — Encounter: Payer: Self-pay | Admitting: Urology

## 2018-05-26 VITALS — BP 89/65 | HR 69 | Wt 192.0 lb

## 2018-05-26 DIAGNOSIS — N138 Other obstructive and reflux uropathy: Secondary | ICD-10-CM

## 2018-05-26 DIAGNOSIS — N401 Enlarged prostate with lower urinary tract symptoms: Secondary | ICD-10-CM | POA: Diagnosis not present

## 2018-05-26 DIAGNOSIS — G311 Senile degeneration of brain, not elsewhere classified: Secondary | ICD-10-CM | POA: Insufficient documentation

## 2018-05-26 DIAGNOSIS — N430 Encysted hydrocele: Secondary | ICD-10-CM | POA: Diagnosis not present

## 2018-05-26 DIAGNOSIS — R350 Frequency of micturition: Secondary | ICD-10-CM

## 2018-05-26 LAB — URINALYSIS, COMPLETE
BILIRUBIN UA: NEGATIVE
Glucose, UA: NEGATIVE
Ketones, UA: NEGATIVE
Leukocytes, UA: NEGATIVE
NITRITE UA: NEGATIVE
PH UA: 6 (ref 5.0–7.5)
Protein, UA: NEGATIVE
RBC UA: NEGATIVE
Specific Gravity, UA: 1.015 (ref 1.005–1.030)
UUROB: 0.2 mg/dL (ref 0.2–1.0)

## 2018-05-26 LAB — MICROSCOPIC EXAMINATION
EPITHELIAL CELLS (NON RENAL): NONE SEEN /HPF (ref 0–10)
RBC MICROSCOPIC, UA: NONE SEEN /HPF (ref 0–2)
WBC UA: NONE SEEN /HPF (ref 0–5)

## 2018-05-26 LAB — BLADDER SCAN AMB NON-IMAGING

## 2018-05-26 NOTE — Progress Notes (Signed)
05/26/2018 9:56 AM   Edwena Blow 1936/11/02 [id number]  Referring provider: Mick Sell, MD [address] [place], [place] [address]  No chief complaint on file.   HPI:  Anthony Thornton is seen as a new patient today.  He has a history of BPH and hydrocele.  He underwent a CT scan January 2019 which revealed a 80 g prostate.  Appeared benign.  He had a left greater than right hydrocele. The hydrocele doesn't bother him. He has some frequency, but can go a few hrs in between trips to bathroom. Pvr 14 ml. He is with his caregiver. He voids in urinal and diaper. No gross hematuria. UA was negative last month and clear today.   PMH: Past Medical History:  Diagnosis Date  . Adjustment disorder with anxiety   . CVA (cerebral vascular accident) (HCC)   . Dementia   . Diabetes mellitus without complication (HCC)   . Glaucoma   . Hypercholesteremia   . Hypertension   . Pacemaker   . PE (pulmonary thromboembolism) (HCC)   . Seizures [facility])     Surgical History: Past Surgical History:  Procedure Laterality Date  . AORTIC VALVE REPLACEMENT (AVR)/CORONARY ARTERY BYPASS GRAFTING (CABG)      Home Medications:  Allergies as of 05/26/2018   No Known Allergies     Medication List        Accurate as of 05/26/18  9:56 AM. Always use your most recent med list.          albuterol 108 (90 Base) MCG/ACT inhaler Commonly known as:  PROVENTIL HFA;VENTOLIN HFA Inhale 2 puffs into the lungs 4 (four) times daily as needed for wheezing.   aspirin 81 MG chewable tablet Chew 81 mg by mouth daily.   atorvastatin 80 MG tablet Commonly known as:  LIPITOR Take 80 mg by mouth at bedtime.   carvedilol 3.125 MG tablet Commonly known as:  COREG Take 1.5625 mg by mouth 2 (two) times daily with a meal.   cefUROXime 500 MG tablet Commonly known as:  CEFTIN Take 1 tablet (500 mg total) by mouth 2 (two) times daily with a meal.   clopidogrel 75 MG tablet Commonly known as:   PLAVIX Take 75 mg by mouth at bedtime.   divalproex 250 MG DR tablet Commonly known as:  DEPAKOTE Take 250-500 mg by mouth 2 (two) times daily. Take 1 tablet (250MG ) by mouth every morning and 2 tablets (500MG ) by mouth every night   donepezil 10 MG tablet Commonly known as:  ARICEPT Take 10 mg by mouth at bedtime.   furosemide 20 MG tablet Commonly known as:  LASIX Take 20 mg by mouth daily.   guaiFENesin 600 MG 12 hr tablet Commonly known as:  MUCINEX Take 1 tablet (600 mg total) by mouth 2 (two) times daily as needed for to loosen phlegm.   lisinopril 5 MG tablet Commonly known as:  PRINIVIL,ZESTRIL Take 5 mg by mouth daily.   LORazepam 1 MG tablet Commonly known as:  ATIVAN Take 0.5 tablets (0.5 mg total) by mouth daily as needed (agitation).   meclizine 12.5 MG tablet Commonly known as:  ANTIVERT Take 12.5 mg by mouth 2 (two) times daily as needed for nausea.   OLANZapine 2.5 MG tablet Commonly known as:  ZYPREXA Take 2.5 mg by mouth at bedtime.   Tiotropium Bromide Monohydrate 1.25 MCG/ACT Aers Inhale 1.25 mcg into the lungs every morning.   traZODone 50 MG tablet Commonly known as:  DESYREL Take 75 mg by mouth at bedtime.       Allergies: No Known Allergies  Family History: No family history on file.  Social History:  reports that he has quit smoking. He has never used smokeless tobacco. He reports that he does not drink alcohol or use drugs.  ROS:                                        Physical Exam: There were no vitals taken for this visit.  Constitutional:  Alert and oriented, No acute distress. HEENT: [place] AT, moist mucus membranes.  Trachea midline, no masses. Cardiovascular: No clubbing, cyanosis, or edema. Respiratory: Normal respiratory effort, no increased work of breathing. GI: Abdomen is soft, nontender, nondistended, no abdominal masses GU: No CVA tenderness, penis uncircumcised,, no phimosis and normal foreskin,  nl meatus, nl scrotum, testcles palpably normal and L>R hydrocele but not all that big.  Lymph: No cervical or inguinal lymphadenopathy. Skin: No rashes, bruises or suspicious lesions. Neurologic: Grossly intact, no focal deficits, moving all 4 extremities. Psychiatric: Normal mood and affect.  Laboratory Data: Lab Results  Component Value Date   WBC 5.4 05/14/2018   HGB 13.0 05/14/2018   HCT 38.5 (L) 05/14/2018   MCV 88.9 05/14/2018   PLT 151 05/14/2018    Lab Results  Component Value Date   CREATININE 0.91 05/14/2018    No results found for: PSA  No results found for: TESTOSTERONE  Lab Results  Component Value Date   HGBA1C 5.9 (H) 02/20/2018    Urinalysis    Component Value Date/Time   COLORURINE YELLOW (A) 05/14/2018 1937   APPEARANCEUR CLEAR (A) 05/14/2018 1937   APPEARANCEUR Clear 04/20/2014 1527   LABSPEC 1.019 05/14/2018 1937   LABSPEC 1.016 04/20/2014 1527   PHURINE 6.0 05/14/2018 1937   GLUCOSEU NEGATIVE 05/14/2018 1937   GLUCOSEU Negative 04/20/2014 1527   HGBUR NEGATIVE 05/14/2018 1937   BILIRUBINUR NEGATIVE 05/14/2018 1937   BILIRUBINUR Negative 04/20/2014 1527   KETONESUR 5 (A) 05/14/2018 1937   PROTEINUR NEGATIVE 05/14/2018 1937   NITRITE NEGATIVE 05/14/2018 1937   LEUKOCYTESUR NEGATIVE 05/14/2018 1937   LEUKOCYTESUR Negative 04/20/2014 1527    Lab Results  Component Value Date   BACTERIA RARE (A) 05/14/2018    Pertinent Imaging: CT images reviewed -- hospital notes and labs reviewed  No results found for this or any previous visit. No results found for this or any previous visit. No results found for this or any previous visit. No results found for this or any previous visit. No results found for this or any previous visit. No results found for this or any previous visit. No results found for this or any previous visit. No results found for this or any previous visit.  Assessment & Plan:    Bph, luts - he's doing well on  surveillance and alpha blocker or OAB meds have a lot of side effects. He has left sided weakness. Higher risk of falls.   Hydrocele - these are benign, not all that large and not bothersome. Continue surveillance.  No follow-ups on file.  Jerilee FieldMatthew Elexa Kivi, MD  [facility] [address], [address] [place], [place] [address] [phone]

## 2018-07-08 ENCOUNTER — Other Ambulatory Visit: Payer: Self-pay

## 2018-07-08 ENCOUNTER — Emergency Department
Admission: EM | Admit: 2018-07-08 | Discharge: 2018-07-08 | Disposition: A | Discharge status: Home / Self Care | Payer: Medicare Other | Attending: Emergency Medicine | Admitting: Emergency Medicine

## 2018-07-08 ENCOUNTER — Encounter: Payer: Self-pay | Admitting: Intensive Care

## 2018-07-08 DIAGNOSIS — Z7902 Long term (current) use of antithrombotics/antiplatelets: Secondary | ICD-10-CM | POA: Insufficient documentation

## 2018-07-08 DIAGNOSIS — F0390 Unspecified dementia without behavioral disturbance: Secondary | ICD-10-CM | POA: Diagnosis not present

## 2018-07-08 DIAGNOSIS — E119 Type 2 diabetes mellitus without complications: Secondary | ICD-10-CM | POA: Insufficient documentation

## 2018-07-08 DIAGNOSIS — I2510 Atherosclerotic heart disease of native coronary artery without angina pectoris: Secondary | ICD-10-CM | POA: Insufficient documentation

## 2018-07-08 DIAGNOSIS — G40909 Epilepsy, unspecified, not intractable, without status epilepticus: Secondary | ICD-10-CM | POA: Diagnosis not present

## 2018-07-08 DIAGNOSIS — Z950 Presence of cardiac pacemaker: Secondary | ICD-10-CM | POA: Insufficient documentation

## 2018-07-08 DIAGNOSIS — I10 Essential (primary) hypertension: Secondary | ICD-10-CM | POA: Diagnosis not present

## 2018-07-08 DIAGNOSIS — Z7984 Long term (current) use of oral hypoglycemic drugs: Secondary | ICD-10-CM | POA: Insufficient documentation

## 2018-07-08 DIAGNOSIS — Z7982 Long term (current) use of aspirin: Secondary | ICD-10-CM | POA: Insufficient documentation

## 2018-07-08 DIAGNOSIS — Z87891 Personal history of nicotine dependence: Secondary | ICD-10-CM | POA: Insufficient documentation

## 2018-07-08 DIAGNOSIS — R569 Unspecified convulsions: Secondary | ICD-10-CM | POA: Diagnosis present

## 2018-07-08 LAB — BASIC METABOLIC PANEL
Anion gap: 9 (ref 5–15)
BUN: 14 mg/dL (ref 8–23)
CALCIUM: 9.2 mg/dL (ref 8.9–10.3)
CHLORIDE: 100 mmol/L (ref 98–111)
CO2: 27 mmol/L (ref 22–32)
CREATININE: 1.07 mg/dL (ref 0.61–1.24)
Glucose, Bld: 91 mg/dL (ref 70–99)
Potassium: 3.7 mmol/L (ref 3.5–5.1)
SODIUM: 136 mmol/L (ref 135–145)

## 2018-07-08 LAB — URINALYSIS, COMPLETE (UACMP) WITH MICROSCOPIC
BILIRUBIN URINE: NEGATIVE
Bacteria, UA: NONE SEEN
GLUCOSE, UA: NEGATIVE mg/dL
HGB URINE DIPSTICK: NEGATIVE
Ketones, ur: NEGATIVE mg/dL
Leukocytes, UA: NEGATIVE
NITRITE: NEGATIVE
PH: 5 (ref 5.0–8.0)
Protein, ur: NEGATIVE mg/dL
Specific Gravity, Urine: 1.014 (ref 1.005–1.030)

## 2018-07-08 LAB — CBC
HCT: 39.8 % — ABNORMAL LOW (ref 40.0–52.0)
Hemoglobin: 13.9 g/dL (ref 13.0–18.0)
MCH: 30.9 pg (ref 26.0–34.0)
MCHC: 34.9 g/dL (ref 32.0–36.0)
MCV: 88.5 fL (ref 80.0–100.0)
PLATELETS: 223 10*3/uL (ref 150–440)
RBC: 4.49 MIL/uL (ref 4.40–5.90)
RDW: 14.4 % (ref 11.5–14.5)
WBC: 4.9 10*3/uL (ref 3.8–10.6)

## 2018-07-08 LAB — VALPROIC ACID LEVEL: VALPROIC ACID LVL: 44 ug/mL — AB (ref 50.0–100.0)

## 2018-07-08 NOTE — ED Notes (Signed)
Report to tiffany at [facility].

## 2018-07-08 NOTE — ED Provider Notes (Signed)
[facility] Emergency Department Provider Note  ____________________________________________  Time seen: Approximately 7:23 PM  I have reviewed the triage vital signs and the nursing notes.   HISTORY  Chief Complaint Seizures  Level 5 Caveat: Portions of the History and Physical including HPI and review of systems are unable to be completely obtained due to patient being a poor historian    HPI Anthony Thornton is a 82 y.o. male with a history of dementia, diabetes, seizure disorder, PE who was sent to the ED from his nursing home today because he had a moment of staring straight ahead.  No convulsive activity noted.  No other unusual symptoms recently.  Patient has been eating in bed in his usual state of health.  Staff reported to EMS that the patient is currently at his mental baseline.  Patient denies any complaints at all and states that he feels fine and normal.      Past Medical History:  Diagnosis Date  . Adjustment disorder with anxiety   . CVA (cerebral vascular accident) (HCC)   . Dementia   . Diabetes mellitus without complication (HCC)   . Glaucoma   . Hypercholesteremia   . Hypertension   . Pacemaker   . PE (pulmonary thromboembolism) (HCC)   . Seizures [facility](HCC)      Patient Active Problem List   Diagnosis Date Noted  . Senile degeneration of brain 05/26/2018  . Pneumonia 04/30/2018  . Acute bronchitis 02/20/2018  . 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 Vascular dementia 08/23/2017  . Dementia 05/13/2017  . Diabetes mellitus type 2, uncomplicated (HCC) 05/13/2017  . CAD (coronary artery disease) 05/12/2017  . Personal history of transient ischemic attack (TIA), and cerebral infarction without residual deficits 06/25/2015  . Ataxia due to recent stroke 05/17/2014  . Hemiplegia affecting non-dominant side, post-stroke 05/17/2014  . Essential hypertension 03/25/2014  . Hyperlipidemia 03/25/2014  .  Shortness of breath 03/25/2014     Past Surgical History:  Procedure Laterality Date  . AORTIC VALVE REPLACEMENT (AVR)/CORONARY ARTERY BYPASS GRAFTING (CABG)       Prior to Admission medications   Medication Sig Start Date End Date Taking? Authorizing Provider  albuterol (PROVENTIL HFA;VENTOLIN HFA) 108 (90 Base) MCG/ACT inhaler Inhale 2 puffs into the lungs 4 (four) times daily as needed for wheezing.     [provider]  aspirin 81 MG chewable tablet Chew 81 mg by mouth daily.    [provider]  atorvastatin (LIPITOR) 80 MG tablet Take 80 mg by mouth at bedtime.     [provider]  carvedilol (COREG) 3.125 MG tablet Take 3.125 mg by mouth as directed. Take 0.5 tab by oral route 2 times per day    [provider]  clopidogrel (PLAVIX) 75 MG tablet Take 75 mg by mouth at bedtime.     [provider]  divalproex (DEPAKOTE) 250 MG DR tablet Take 250-500 mg by mouth 2 (two) times daily. Take 1 tablet (250MG ) by mouth every morning and 2 tablets (500MG ) by mouth every night    [provider]  donepezil (ARICEPT) 10 MG tablet Take 10 mg by mouth at bedtime.     [provider]  furosemide (LASIX) 20 MG tablet Take 20 mg by mouth daily. 10/28/16   [provider]  guaiFENesin (MUCINEX) 600 MG 12 hr tablet Take 1 tablet (600 mg total) by mouth 2 (two) times daily as needed for to loosen phlegm. 05/02/18  05/02/19  Adrian Saran, MD  lisinopril (PRINIVIL,ZESTRIL) 5 MG tablet Take 5 mg by mouth daily. 11/29/16   [provider]  LORazepam (ATIVAN) 1 MG tablet Take 0.5 tablets (0.5 mg total) by mouth daily as needed (agitation). 05/02/18   Adrian Saran, MD  meclizine (ANTIVERT) 12.5 MG tablet Take 12.5 mg by mouth 2 (two) times daily as needed for nausea.     [provider]  metFORMIN (GLUCOPHAGE) 500 MG tablet Take by mouth 2 (two) times daily with a meal.    [provider]  OLANZapine (ZYPREXA) 2.5 MG  tablet Take 2.5 mg by mouth at bedtime.     [provider]  Tiotropium Bromide Monohydrate 1.25 MCG/ACT AERS Inhale 1.25 mcg into the lungs every morning.     [provider]  traZODone (DESYREL) 50 MG tablet Take 75 mg by mouth at bedtime.     [provider]     Allergies Patient has no known allergies.   History reviewed. No pertinent family history.  Social History Social History   Tobacco Use  . Smoking status: Former Games developer  . Smokeless tobacco: Never Used  Substance Use Topics  . Alcohol use: No  . Drug use: No    Review of Systems  Constitutional:   No fever or chills.  ENT:   No sore throat. No rhinorrhea. Cardiovascular:   No chest pain or syncope. Respiratory:   No dyspnea or cough. Gastrointestinal:   Negative for abdominal pain, vomiting and diarrhea.  Musculoskeletal:   Negative for focal pain or swelling All other systems reviewed and are negative except as documented above in ROS and HPI.  ____________________________________________   PHYSICAL EXAM:  VITAL SIGNS: ED Triage Vitals  Enc Vitals Group     BP 07/08/18 1759 121/62     Pulse Rate 07/08/18 1759 (!) 57     Resp 07/08/18 1759 12     Temp 07/08/18 1759 97.6 F (36.4 C)     Temp Source 07/08/18 1759 Oral     SpO2 07/08/18 1759 95 %     Weight 07/08/18 1801 201 lb 4.5 oz (91.3 kg)     Height 07/08/18 1801 5\' 7"  (1.702 m)     Head Circumference --      Peak Flow --      Pain Score --      Pain Loc --      Pain Edu? --      Excl. in GC? --     Vital signs reviewed, nursing assessments reviewed.   Constitutional:   Alert and oriented to person and place. Non-toxic appearance. Eyes:   Conjunctivae are normal. EOMI. PERRL. ENT      Head:   Normocephalic and atraumatic.      Nose:   No congestion/rhinnorhea.       Mouth/Throat:   MMM, no pharyngeal erythema. No peritonsillar mass.       Neck:   No meningismus. Full  ROM. Hematological/Lymphatic/Immunilogical:   No cervical lymphadenopathy. Cardiovascular:   RRR. Symmetric bilateral radial and DP pulses.  No murmurs. Cap refill less than 2 seconds. Respiratory:   Normal respiratory effort without tachypnea/retractions. Breath sounds are clear and equal bilaterally. No wheezes/rales/rhonchi. Gastrointestinal:   Soft and nontender. Non distended. There is no CVA tenderness.  No rebound, rigidity, or guarding. Genitourinary:   deferred Musculoskeletal:   Normal range of motion in all extremities. No joint effusions.  No lower extremity tenderness.  No edema. Neurologic:  Mumbling speech.  Patient states "cold" when I put my hands on his legs to examine. Motor grossly intact. No acute focal neurologic deficits are appreciated.  Skin:    Skin is warm, dry and intact. No rash noted.  No petechiae, purpura, or bullae.  ____________________________________________    LABS (pertinent positives/negatives) (all labs ordered are listed, but only abnormal results are displayed) Labs Reviewed  CBC - Abnormal; Notable for the following components:      Result Value   HCT 39.8 (*)    All other components within normal limits  VALPROIC ACID LEVEL - Abnormal; Notable for the following components:   Valproic Acid Lvl 44 (*)    All other components within normal limits  BASIC METABOLIC PANEL  URINALYSIS, COMPLETE (UACMP) WITH MICROSCOPIC   ____________________________________________   EKG  Interpreted by me Sinus rhythm rate of 57, left axis, normal intervals.  Right bundle branch block with associated repolarization abnormality.  No acute ischemic changes.  Repeat EKG performed 5 minutes later shows sinus rhythm, rate of 60, right axis, right bundle branch block, no acute ischemic changes, no interval change.  ____________________________________________    RADIOLOGY  No results  found.  ____________________________________________   PROCEDURES Procedures  ____________________________________________    CLINICAL IMPRESSION / ASSESSMENT AND PLAN / ED COURSE  Pertinent labs & imaging results that were available during my care of the patient were reviewed by me and considered in my medical decision making (see chart for details).    Patient sent to the ED for evaluation due to a staring episode.  He is at baseline mental status, denies any complaints.  No acute complaints related by nursing home staff or EMS.  Vital signs unremarkable.  Exam is benign and reassuring.  Labs are unremarkable.  Will obtain a urinalysis to screen for urinary tract infection given his dementia, age, comorbidities that put him at risk for occult UTI.  Afterward patient will be stable for discharge home with or without antibiotics as indicated.  No evidence of seizure or postictal phase during EMS transport or in the ED.      ____________________________________________   FINAL CLINICAL IMPRESSION(S) / ED DIAGNOSES    Final diagnoses:  Chronic dementia without behavioral disturbance  Seizure disorder [facility])     ED Discharge Orders    None      Portions of this note were generated with dragon dictation software. Dictation errors may occur despite best attempts at proofreading.    Sharman Cheek, MD 07/08/18 [phone]

## 2018-07-08 NOTE — ED Triage Notes (Signed)
Patient from [facility]. Staff reports he was eating dinner and had a "focal seizure." staff also reports this happens often and is sent to ER and returns with no significant findings. 123/60 b/p, 56HR, 96% RA, cbg 95. Patient is at baseline but appeared more lethargic than normal per staff. Patients speech is incomprehensible. HX Dementia.

## 2018-07-08 NOTE — Discharge Instructions (Addendum)
Your labs today were unremarkable.

## 2018-08-13 ENCOUNTER — Emergency Department
Admission: EM | Admit: 2018-08-13 | Discharge: 2018-08-13 | Disposition: A | Discharge status: Skilled Nursing Facility | Payer: Medicare Other | Attending: Emergency Medicine | Admitting: Emergency Medicine

## 2018-08-13 ENCOUNTER — Emergency Department: Payer: Medicare Other

## 2018-08-13 ENCOUNTER — Other Ambulatory Visit: Payer: Self-pay

## 2018-08-13 DIAGNOSIS — Z7984 Long term (current) use of oral hypoglycemic drugs: Secondary | ICD-10-CM | POA: Diagnosis not present

## 2018-08-13 DIAGNOSIS — Z79899 Other long term (current) drug therapy: Secondary | ICD-10-CM | POA: Insufficient documentation

## 2018-08-13 DIAGNOSIS — F0390 Unspecified dementia without behavioral disturbance: Secondary | ICD-10-CM | POA: Insufficient documentation

## 2018-08-13 DIAGNOSIS — I10 Essential (primary) hypertension: Secondary | ICD-10-CM | POA: Diagnosis not present

## 2018-08-13 DIAGNOSIS — Z87891 Personal history of nicotine dependence: Secondary | ICD-10-CM | POA: Insufficient documentation

## 2018-08-13 DIAGNOSIS — E119 Type 2 diabetes mellitus without complications: Secondary | ICD-10-CM | POA: Insufficient documentation

## 2018-08-13 DIAGNOSIS — Z7901 Long term (current) use of anticoagulants: Secondary | ICD-10-CM | POA: Insufficient documentation

## 2018-08-13 DIAGNOSIS — Z950 Presence of cardiac pacemaker: Secondary | ICD-10-CM | POA: Diagnosis not present

## 2018-08-13 DIAGNOSIS — R569 Unspecified convulsions: Secondary | ICD-10-CM | POA: Insufficient documentation

## 2018-08-13 DIAGNOSIS — I2510 Atherosclerotic heart disease of native coronary artery without angina pectoris: Secondary | ICD-10-CM | POA: Diagnosis not present

## 2018-08-13 LAB — URINALYSIS, COMPLETE (UACMP) WITH MICROSCOPIC
Bacteria, UA: NONE SEEN
Bilirubin Urine: NEGATIVE
Glucose, UA: NEGATIVE mg/dL
Hgb urine dipstick: NEGATIVE
KETONES UR: 5 mg/dL — AB
Leukocytes, UA: NEGATIVE
Nitrite: NEGATIVE
Protein, ur: 30 mg/dL — AB
SPECIFIC GRAVITY, URINE: 1.024 (ref 1.005–1.030)
pH: 5 (ref 5.0–8.0)

## 2018-08-13 LAB — CBC WITH DIFFERENTIAL/PLATELET
Basophils Absolute: 0 10*3/uL (ref 0–0.1)
Basophils Relative: 1 %
EOS PCT: 4 %
Eosinophils Absolute: 0.2 10*3/uL (ref 0–0.7)
HCT: 34.9 % — ABNORMAL LOW (ref 40.0–52.0)
Hemoglobin: 12.3 g/dL — ABNORMAL LOW (ref 13.0–18.0)
LYMPHS ABS: 2.1 10*3/uL (ref 1.0–3.6)
LYMPHS PCT: 46 %
MCH: 31.1 pg (ref 26.0–34.0)
MCHC: 35.3 g/dL (ref 32.0–36.0)
MCV: 88 fL (ref 80.0–100.0)
MONO ABS: 0.7 10*3/uL (ref 0.2–1.0)
Monocytes Relative: 14 %
Neutro Abs: 1.7 10*3/uL (ref 1.4–6.5)
Neutrophils Relative %: 35 %
PLATELETS: 189 10*3/uL (ref 150–440)
RBC: 3.96 MIL/uL — ABNORMAL LOW (ref 4.40–5.90)
RDW: 14.5 % (ref 11.5–14.5)
WBC: 4.7 10*3/uL (ref 3.8–10.6)

## 2018-08-13 LAB — BASIC METABOLIC PANEL
ANION GAP: 8 (ref 5–15)
BUN: 10 mg/dL (ref 8–23)
CALCIUM: 8.7 mg/dL — AB (ref 8.9–10.3)
CHLORIDE: 101 mmol/L (ref 98–111)
CO2: 27 mmol/L (ref 22–32)
CREATININE: 0.9 mg/dL (ref 0.61–1.24)
GFR calc non Af Amer: >60 mL/min (ref >60)
Glucose, Bld: 106 mg/dL — ABNORMAL HIGH (ref 70–99)
Potassium: 3.7 mmol/L (ref 3.5–5.1)
SODIUM: 136 mmol/L (ref 135–145)

## 2018-08-13 LAB — VALPROIC ACID LEVEL: Valproic Acid Lvl: 39 ug/mL — ABNORMAL LOW (ref 50.0–100.0)

## 2018-08-13 MED ORDER — DIVALPROEX SODIUM 500 MG PO DR TAB
500.0000 mg | DELAYED_RELEASE_TABLET | Freq: Once | ORAL | Status: AC
  Administered 2018-08-13: 500 mg via ORAL
  Filled 2018-08-13: qty 1

## 2018-08-13 MED ORDER — DIVALPROEX SODIUM 500 MG PO DR TAB
500.0000 mg | DELAYED_RELEASE_TABLET | ORAL | Status: AC
  Administered 2018-08-13: 500 mg via ORAL
  Filled 2018-08-13: qty 1

## 2018-08-13 NOTE — ED Notes (Signed)
Patient transported to CT 

## 2018-08-13 NOTE — Discharge Instructions (Signed)
Your Depakote level was slightly low today.  Be sure you are continuing to take the medication on the schedule recommended by Dr. Malvin Johns during your clinic evaluation 1 month ago.  We gave you extra Depakote tonight to help increase this level.  Please follow-up with Dr. Malvin Johns this week.  Your other tests including blood and urine tests and CT scan of the head were unremarkable today.

## 2018-08-13 NOTE — ED Notes (Signed)
Report called to Daylene Katayama, MT at [facility].

## 2018-08-13 NOTE — ED Provider Notes (Signed)
[facility] Emergency Department Provider Note  ____________________________________________  Time seen: Approximately 10:18 PM  I have reviewed the triage vital signs and the nursing notes.   HISTORY  Chief Complaint Seizures  Level 5 Caveat: Portions of the History and Physical including HPI and review of systems are unable to be completely obtained due to patient being a poor historian due to chronic dementia   HPI Anthony Thornton is a 82 y.o. male with a history of stroke, dementia, diabetes, hypertension, seizures who is brought to the ED today by EMS due to a seizure where he lives at [facility].  Was noted that the patient had loss of consciousness and suspected convulsive activity at [facility].  They called EMS who noted that he did not have any seizure activity in their presence or during transport.  They do note that he is less responsive than baseline.  No reversal agents given by EMS.    Patient denies any complaints.  States he feels fine.  No chest pain shortness of breath or belly pain.  No headache vision changes.  No recent falls or trauma.    Past Medical History:  Diagnosis Date  . Adjustment disorder with anxiety   . CVA (cerebral vascular accident) (HCC)   . Dementia (HCC)   . Diabetes mellitus without complication (HCC)   . Glaucoma   . Hypercholesteremia   . Hypertension   . Pacemaker   . PE (pulmonary thromboembolism) (HCC)   . Seizures [facility])      Patient Active Problem List   Diagnosis Date Noted  . Senile degeneration of brain (HCC) 05/26/2018  . Pneumonia 04/30/2018  . Acute bronchitis 02/20/2018  . 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 Vascular dementia (HCC) 08/23/2017  . Dementia (HCC) 05/13/2017  . Diabetes mellitus type 2, uncomplicated (HCC) 05/13/2017  . CAD (coronary artery disease) 05/12/2017  . Personal history of transient ischemic attack (TIA), and cerebral  infarction without residual deficits 06/25/2015  . Ataxia due to recent stroke 05/17/2014  . Hemiplegia affecting non-dominant side, post-stroke 05/17/2014  . Essential hypertension 03/25/2014  . Hyperlipidemia 03/25/2014  . Shortness of breath 03/25/2014     Past Surgical History:  Procedure Laterality Date  . AORTIC VALVE REPLACEMENT (AVR)/CORONARY ARTERY BYPASS GRAFTING (CABG)       Prior to Admission medications   Medication Sig Start Date End Date Taking? Authorizing Provider  acetaminophen (TYLENOL) 500 MG tablet Take 500 mg by mouth every 4 (four) hours as needed.   Yes [provider]  albuterol (PROVENTIL HFA;VENTOLIN HFA) 108 (90 Base) MCG/ACT inhaler Inhale 2 puffs into the lungs 4 (four) times daily as needed for wheezing.    Yes [provider]  aspirin 81 MG chewable tablet Chew 81 mg by mouth daily.   Yes [provider]  atorvastatin (LIPITOR) 80 MG tablet Take 80 mg by mouth at bedtime.    Yes [provider]  carvedilol (COREG) 3.125 MG tablet Take 3.125 mg by mouth as directed. Take 0.5 tab by oral route 2 times per day   Yes [provider]  clopidogrel (PLAVIX) 75 MG tablet Take 75 mg by mouth at bedtime.    Yes [provider]  divalproex (DEPAKOTE) 250 MG DR tablet Take 500 mg by mouth daily. At 0900   Yes [provider]  donepezil (ARICEPT) 10 MG tablet Take 10 mg by mouth at bedtime.    Yes [provider]  furosemide (LASIX) 20 MG tablet Take 20 mg by mouth daily. 10/28/16  Yes [provider]  guaiFENesin (MUCINEX) 600 MG 12 hr tablet Take 1 tablet (600 mg total) by mouth 2 (two) times daily as needed for to loosen phlegm. 05/02/18 05/02/19 Yes Mody, Patricia Pesa, MD  guaiFENesin (ROBITUSSIN) 100 MG/5ML liquid Take 200 mg by mouth 4 (four) times daily as needed for cough.   Yes [provider]  lisinopril (PRINIVIL,ZESTRIL) 5 MG tablet Take 5 mg by mouth daily. 11/29/16  Yes  [provider]  loperamide (IMODIUM) 2 MG capsule Take 2 mg by mouth as needed for diarrhea or loose stools.   Yes [provider]  LORazepam (ATIVAN) 1 MG tablet Take 0.5 tablets (0.5 mg total) by mouth daily as needed (agitation). 05/02/18  Yes Adrian Saran, MD  meclizine (ANTIVERT) 12.5 MG tablet Take 12.5 mg by mouth 2 (two) times daily as needed for nausea.    Yes [provider]  metFORMIN (GLUCOPHAGE) 500 MG tablet Take by mouth 2 (two) times daily with a meal.   Yes [provider]  OLANZapine (ZYPREXA) 2.5 MG tablet Take 2.5 mg by mouth at bedtime.    Yes [provider]  QUEtiapine (SEROQUEL) 25 MG tablet Take 25 mg by mouth 2 (two) times daily.   Yes [provider]  Tiotropium Bromide Monohydrate 1.25 MCG/ACT AERS Inhale 1.25 mcg into the lungs every morning.    Yes [provider]  traZODone (DESYREL) 50 MG tablet Take 75 mg by mouth at bedtime.    Yes [provider]     Allergies Patient has no known allergies.   No family history on file.  Social History Social History   Tobacco Use  . Smoking status: Former Games developer  . Smokeless tobacco: Never Used  Substance Use Topics  . Alcohol use: No  . Drug use: No    Review of Systems  Level 5 Caveat: Portions of the History and Physical including HPI and review of systems are unable to be completely obtained due to patient being a poor historian   ____________________________________________   PHYSICAL EXAM:  VITAL SIGNS: ED Triage Vitals  Enc Vitals Group     BP 08/13/18 1815 (!) 116/50     Pulse Rate 08/13/18 1815 (!) 53     Resp 08/13/18 1815 (!) 22     Temp 08/13/18 1815 97.9 F (36.6 C)     Temp Source 08/13/18 1815 Oral     SpO2 08/13/18 1815 96 %     Weight 08/13/18 1817 191 lb 9.3 oz (86.9 kg)     Height 08/13/18 1817 6\' 4"  (1.93 m)     Head Circumference --      Peak Flow --      Pain Score 08/13/18 1817 0     Pain Loc --       Pain Edu? --      Excl. in GC? --     Vital signs reviewed, nursing assessments reviewed.   Constitutional:   Alert and oriented to self. Non-toxic appearance. Eyes:   Conjunctivae are normal. EOMI. PERRL. ENT      Head:   Normocephalic and atraumatic.      Nose:   Positive rhinorrhea.       Mouth/Throat:   MMM, no pharyngeal erythema. No peritonsillar mass.  Pinpoint tongue abrasion at the left anterior tongue, hemostatic.  No laceration      Neck:   No meningismus.  Full ROM. Hematological/Lymphatic/Immunilogical:   No cervical lymphadenopathy. Cardiovascular:   RRR. Symmetric bilateral radial and DP pulses.  No murmurs. Cap refill less than 2 seconds. Respiratory:   Normal respiratory effort without tachypnea/retractions. Breath sounds are clear and equal bilaterally. No wheezes/rales/rhonchi. Gastrointestinal:   Soft and nontender. Non distended. There is no CVA tenderness.  No rebound, rigidity, or guarding. Musculoskeletal:   Normal range of motion in all extremities. No joint effusions.  No lower extremity tenderness.  No edema.  No apparent injuries. Neurologic:   Normal speech and language.  Motor grossly intact. No acute focal neurologic deficits are appreciated.  Skin:    Skin is warm, dry and intact. No rash noted.  No petechiae, purpura, or bullae.  ____________________________________________    LABS (pertinent positives/negatives) (all labs ordered are listed, but only abnormal results are displayed) Labs Reviewed  VALPROIC ACID LEVEL - Abnormal; Notable for the following components:      Result Value   Valproic Acid Lvl 39 (*)    All other components within normal limits  BASIC METABOLIC PANEL - Abnormal; Notable for the following components:   Glucose, Bld 106 (*)    Calcium 8.7 (*)    All other components within normal limits  CBC WITH DIFFERENTIAL/PLATELET - Abnormal; Notable for the following components:   RBC 3.96 (*)    Hemoglobin 12.3 (*)    HCT 34.9  (*)    All other components within normal limits  URINALYSIS, COMPLETE (UACMP) WITH MICROSCOPIC - Abnormal; Notable for the following components:   Color, Urine YELLOW (*)    APPearance CLEAR (*)    Ketones, ur 5 (*)    Protein, ur 30 (*)    All other components within normal limits  URINE CULTURE   ____________________________________________   EKG  Interpreted by me Sinus rhythm rate of 50, right axis, normal intervals.  Right bundle branch block.  No acute ischemic changes.  ____________________________________________    RADIOLOGY  Ct Head Wo Contrast  Result Date: 08/13/2018 CLINICAL DATA:  Seizure. EXAM: CT HEAD WITHOUT CONTRAST TECHNIQUE: Contiguous axial images were obtained from the base of the skull through the vertex without intravenous contrast. COMPARISON:  05/14/2018 FINDINGS: Brain: There is no evidence for acute hemorrhage, hydrocephalus, mass lesion, or abnormal extra-axial fluid collection. No definite CT evidence for acute infarction. Diffuse loss of parenchymal volume is consistent with atrophy. Patchy low attenuation in the deep hemispheric and periventricular white matter is nonspecific, but likely reflects chronic microvascular ischemic demyelination. Old right parietal infarct noted with bilateral hemispheric watershed infarcts similar to prior. Vascular: No hyperdense vessel or unexpected calcification. Skull: No evidence for fracture. No worrisome lytic or sclerotic lesion. Sinuses/Orbits: The visualized paranasal sinuses and mastoid air cells are clear. Visualized portions of the globes and intraorbital fat are unremarkable. Other: None. IMPRESSION: 1. Stable.  No acute intracranial abnormality. 2. Chronic bilateral infarcts. 3. Atrophy with chronic small vessel white matter ischemic disease. Electronically Signed   By: Kennith Center M.D.   On: 08/13/2018 18:49   Dg Chest Portable 1 View  Result Date: 08/13/2018 CLINICAL DATA:  Seizure, unresponsive EXAM:  PORTABLE CHEST 1 VIEW COMPARISON:  05/14/2018 FINDINGS: Lungs are clear.  No pleural effusion or pneumothorax. The heart is normal in size. Median sternotomy. IMPRESSION: Normal chest radiographs. Electronically Signed   By: Charline Bills M.D.   On: 08/13/2018 18:55    ____________________________________________   PROCEDURES Procedures  ____________________________________________  DIFFERENTIAL DIAGNOSIS   Recurrent epileptic seizure, subdural hematoma,  epidural hematoma, pneumonia, urinary tract infection, electrolyte disturbance, subtherapeutic antiepileptics  CLINICAL IMPRESSION / ASSESSMENT AND PLAN / ED COURSE  Pertinent labs & imaging results that were available during my care of the patient were reviewed by me and considered in my medical decision making (see chart for details).    Patient presents after likely seizure at home versus syncope.  Vital signs are normal on arrival, mental status appears to be back to baseline.  Check labs chest x-ray urinalysis.  Due to baseline dementia and limited history, will obtain CT scan of the head as well due to possibility of occult trauma that was not witnessed or known previously.  Clinical Course as of Aug 14 2223  Wynelle Link Aug 13, 2018  2006 Valproic Acid,S(!): 39 [PS]  2010 Labs, chest x-ray, CT head all unremarkable.  Awaiting urinalysis.  His Depakote level is subtherapeutic and I will give an additional oral Depakote dose to increase his seizure protection.  So far no seizures in the ED.  If he remains seizure-free I think he will be stable for discharge home to follow-up with his neurologist tomorrow.   [PS]    Clinical Course User Index [PS] Sharman Cheek, MD     ____________________________________________   FINAL CLINICAL IMPRESSION(S) / ED DIAGNOSES    Final diagnoses:  Seizure [facility])     ED Discharge Orders    None      Portions of this note were generated with dragon dictation software. Dictation errors  may occur despite best attempts at proofreading.    Sharman Cheek, MD 08/13/18 2224

## 2018-08-13 NOTE — ED Triage Notes (Signed)
Pt to ED via [facility]. Per ems pts facility stated pt was seizing for 25 minutes on and off before they called 911. Upon EMS arrival "pt not super responsive." pt was responsive to pain and movements while in ems care. Per ems [facility] stated they were unsure if pt received his seizure medication tonight or not. Pt has congested cough. EMS vitals: 112/50 54 pulse 116 cbg

## 2018-08-15 LAB — URINE CULTURE: Culture: <10000 — AB

## 2018-09-25 ENCOUNTER — Emergency Department
Admission: EM | Admit: 2018-09-25 | Discharge: 2018-09-25 | Disposition: A | Discharge status: Home / Self Care | Attending: Emergency Medicine | Admitting: Emergency Medicine

## 2018-09-25 ENCOUNTER — Other Ambulatory Visit: Payer: Self-pay

## 2018-09-25 ENCOUNTER — Encounter: Payer: Self-pay | Admitting: Emergency Medicine

## 2018-09-25 DIAGNOSIS — I10 Essential (primary) hypertension: Secondary | ICD-10-CM | POA: Insufficient documentation

## 2018-09-25 DIAGNOSIS — Z951 Presence of aortocoronary bypass graft: Secondary | ICD-10-CM | POA: Insufficient documentation

## 2018-09-25 DIAGNOSIS — Y939 Activity, unspecified: Secondary | ICD-10-CM | POA: Diagnosis not present

## 2018-09-25 DIAGNOSIS — Y929 Unspecified place or not applicable: Secondary | ICD-10-CM | POA: Insufficient documentation

## 2018-09-25 DIAGNOSIS — I2510 Atherosclerotic heart disease of native coronary artery without angina pectoris: Secondary | ICD-10-CM | POA: Diagnosis not present

## 2018-09-25 DIAGNOSIS — F0391 Unspecified dementia with behavioral disturbance: Secondary | ICD-10-CM | POA: Insufficient documentation

## 2018-09-25 DIAGNOSIS — Z7902 Long term (current) use of antithrombotics/antiplatelets: Secondary | ICD-10-CM | POA: Diagnosis not present

## 2018-09-25 DIAGNOSIS — Z950 Presence of cardiac pacemaker: Secondary | ICD-10-CM | POA: Diagnosis not present

## 2018-09-25 DIAGNOSIS — S0991XA Unspecified injury of ear, initial encounter: Secondary | ICD-10-CM | POA: Diagnosis present

## 2018-09-25 DIAGNOSIS — Z7982 Long term (current) use of aspirin: Secondary | ICD-10-CM | POA: Diagnosis not present

## 2018-09-25 DIAGNOSIS — E119 Type 2 diabetes mellitus without complications: Secondary | ICD-10-CM | POA: Insufficient documentation

## 2018-09-25 DIAGNOSIS — Z7984 Long term (current) use of oral hypoglycemic drugs: Secondary | ICD-10-CM | POA: Insufficient documentation

## 2018-09-25 DIAGNOSIS — Y999 Unspecified external cause status: Secondary | ICD-10-CM | POA: Diagnosis not present

## 2018-09-25 DIAGNOSIS — W0110XA Fall on same level from slipping, tripping and stumbling with subsequent striking against unspecified object, initial encounter: Secondary | ICD-10-CM | POA: Diagnosis not present

## 2018-09-25 DIAGNOSIS — S01312A Laceration without foreign body of left ear, initial encounter: Secondary | ICD-10-CM | POA: Diagnosis not present

## 2018-09-25 MED ORDER — LIDOCAINE HCL (PF) 1 % IJ SOLN
5.0000 mL | Freq: Once | INTRAMUSCULAR | Status: AC
  Administered 2018-09-25: 5 mL via INTRADERMAL
  Filled 2018-09-25: qty 5

## 2018-09-25 MED ORDER — LIDOCAINE HCL (PF) 1 % IJ SOLN
5.0000 mL | Freq: Once | INTRAMUSCULAR | Status: AC
  Administered 2018-09-25: 5 mL via INTRADERMAL

## 2018-09-25 MED ORDER — LIDOCAINE HCL (PF) 1 % IJ SOLN
INTRAMUSCULAR | Status: AC
  Administered 2018-09-25: 5 mL via INTRADERMAL
  Filled 2018-09-25: qty 5

## 2018-09-25 NOTE — ED Notes (Signed)
EMS at bedside, report given to ems.

## 2018-09-25 NOTE — ED Triage Notes (Addendum)
Patient had a witnessed slip and fall at [facility] this am about 0730. Laceration L outer ear. Patient has dementia and is from locked unit of  house. Patient in wheelchair by first nurse. Patient's wife elaine contacted at [phone] and states is healthcare power of attorney and gives permission to be treated.

## 2018-09-25 NOTE — ED Notes (Signed)
Pt has attempted to get up from the stretcher twice without assistance, attempts to redirect the pt unsuccessful, charge nurse notified and safety sitter to come in the room.

## 2018-09-25 NOTE — Discharge Instructions (Addendum)
The laceration of your left ear was repaired with absorbable stitches which will come out on their own in about a week.  The area should be monitored for any signs of infection such as redness swelling warmth or drainage.  Please follow-up with your doctor in 2 days for reassessment.

## 2018-09-25 NOTE — ED Notes (Signed)
Unsuccessful attempts at calling report to facility. Placed on hold for 10 minutes and no one answered

## 2018-09-25 NOTE — ED Notes (Signed)
Pt's head wrapped with kerlex

## 2018-09-25 NOTE — ED Provider Notes (Signed)
[facility] Emergency Department Provider Note  ____________________________________________  Time seen: Approximately 6:27 PM  I have reviewed the triage vital signs and the nursing notes.   HISTORY  Chief Complaint Laceration  Level 5 Caveat: Portions of the History and Physical including HPI and review of systems are unable to be completely obtained due to patient being a poor historian due to chronic dementia   HPI Anthony Thornton is a 82 y.o. male sent to the ED after a witnessed slip and fall causing laceration of the left outer ear.  Patient otherwise acting himself and without any other acute complaints.      Past Medical History:  Diagnosis Date  . Adjustment disorder with anxiety   . CVA (cerebral vascular accident) (HCC)   . Dementia (HCC)   . Diabetes mellitus without complication (HCC)   . Glaucoma   . Hypercholesteremia   . Hypertension   . Pacemaker   . PE (pulmonary thromboembolism) (HCC)   . Seizures [facility](HCC)      Patient Active Problem List   Diagnosis Date Noted  . Senile degeneration of brain (HCC) 05/26/2018  . Pneumonia 04/30/2018  . Acute bronchitis 02/20/2018  . 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 Vascular dementia (HCC) 08/23/2017  . Dementia (HCC) 05/13/2017  . Diabetes mellitus type 2, uncomplicated (HCC) 05/13/2017  . CAD (coronary artery disease) 05/12/2017  . Personal history of transient ischemic attack (TIA), and cerebral infarction without residual deficits 06/25/2015  . Ataxia due to recent stroke 05/17/2014  . Hemiplegia affecting non-dominant side, post-stroke 05/17/2014  . Essential hypertension 03/25/2014  . Hyperlipidemia 03/25/2014  . Shortness of breath 03/25/2014     Past Surgical History:  Procedure Laterality Date  . AORTIC VALVE REPLACEMENT (AVR)/CORONARY ARTERY BYPASS GRAFTING (CABG)       Prior to Admission medications   Medication Sig Start Date  End Date Taking? Authorizing Provider  acetaminophen (TYLENOL) 500 MG tablet Take 500 mg by mouth every 4 (four) hours as needed.    [provider]  albuterol (PROVENTIL HFA;VENTOLIN HFA) 108 (90 Base) MCG/ACT inhaler Inhale 2 puffs into the lungs 4 (four) times daily as needed for wheezing.     [provider]  aspirin 81 MG chewable tablet Chew 81 mg by mouth daily.    [provider]  atorvastatin (LIPITOR) 80 MG tablet Take 80 mg by mouth at bedtime.     [provider]  carvedilol (COREG) 3.125 MG tablet Take 3.125 mg by mouth as directed. Take 0.5 tab by oral route 2 times per day    [provider]  clopidogrel (PLAVIX) 75 MG tablet Take 75 mg by mouth at bedtime.     [provider]  divalproex (DEPAKOTE) 250 MG DR tablet Take 500 mg by mouth daily. At 0900    [provider]  donepezil (ARICEPT) 10 MG tablet Take 10 mg by mouth at bedtime.     [provider]  furosemide (LASIX) 20 MG tablet Take 20 mg by mouth daily. 10/28/16   [provider]  guaiFENesin (MUCINEX) 600 MG 12 hr tablet Take 1 tablet (600 mg total) by mouth 2 (two) times daily as needed for to loosen phlegm. 05/02/18 05/02/19  Adrian SaranMody, Sital, MD  guaiFENesin (ROBITUSSIN) 100 MG/5ML liquid Take 200 mg by mouth 4 (four) times daily as needed for cough.    [provider]  lisinopril (PRINIVIL,ZESTRIL) 5 MG tablet Take 5 mg by  mouth daily. 11/29/16   [provider]  loperamide (IMODIUM) 2 MG capsule Take 2 mg by mouth as needed for diarrhea or loose stools.    [provider]  LORazepam (ATIVAN) 1 MG tablet Take 0.5 tablets (0.5 mg total) by mouth daily as needed (agitation). 05/02/18   Adrian Saran, MD  meclizine (ANTIVERT) 12.5 MG tablet Take 12.5 mg by mouth 2 (two) times daily as needed for nausea.     [provider]  metFORMIN (GLUCOPHAGE) 500 MG tablet Take by mouth 2 (two) times daily with a meal.    [provider]  OLANZapine (ZYPREXA) 2.5 MG tablet Take 2.5 mg by mouth at bedtime.     [provider]  QUEtiapine (SEROQUEL) 25 MG tablet Take 25 mg by mouth 2 (two) times daily.    [provider]  Tiotropium Bromide Monohydrate 1.25 MCG/ACT AERS Inhale 1.25 mcg into the lungs every morning.     [provider]  traZODone (DESYREL) 50 MG tablet Take 75 mg by mouth at bedtime.     [provider]     Allergies Patient has no known allergies.   No family history on file.  Social History Social History   Tobacco Use  . Smoking status: Former Games developer  . Smokeless tobacco: Never Used  Substance Use Topics  . Alcohol use: No  . Drug use: No    Review of Systems Level 5 Caveat: Portions of the History and Physical including HPI and review of systems are unable to be completely obtained due to patient being a poor historian due to advanced dementia  ____________________________________________   PHYSICAL EXAM:  VITAL SIGNS: ED Triage Vitals  Enc Vitals Group     BP 09/25/18 1544 (!) 123/58     Pulse Rate 09/25/18 1544 64     Resp 09/25/18 1544 20     Temp 09/25/18 1544 97.9 F (36.6 C)     Temp Source 09/25/18 1544 Oral     SpO2 09/25/18 1544 97 %     Weight 09/25/18 1546 180 lb (81.6 kg)     Height 09/25/18 1546 6\' 6"  (1.981 m)     Head Circumference --      Peak Flow --      Pain Score 09/25/18 1546 0     Pain Loc --      Pain Edu? --      Excl. in GC? --     Vital signs reviewed, nursing assessments reviewed.   Constitutional:   Alert and oriented to self. Non-toxic appearance. Eyes:   Conjunctivae are normal. EOMI. PERRL. ENT      Head:   Normocephalic with left ear laceration through the cartilage but posterior skin surface intact.  Approximately 3.5 cm in length.  External canal normal, no hemotympanum or TM rupture.  No battle sign or raccoon eyes..      Nose:   No congestion/rhinnorhea.  No epistaxis      Mouth/Throat:    MMM, no pharyngeal erythema. No peritonsillar mass.       Neck:   No meningismus. Full ROM.  No midline spinal tenderness. Hematological/Lymphatic/Immunilogical:   No cervical lymphadenopathy. Cardiovascular:   RRR. Symmetric bilateral radial and DP pulses.  No murmurs. Cap refill less than 2 seconds. Respiratory:   Normal respiratory effort without tachypnea/retractions. Breath sounds are clear and equal bilaterally. No wheezes/rales/rhonchi. Gastrointestinal:   Soft and nontender. Non distended. There is no CVA tenderness.  No rebound, rigidity,  or guarding. Musculoskeletal:   Normal range of motion in all extremities. No joint effusions.  No lower extremity tenderness.  No edema. Neurologic:   Normal speech and language.  Motor grossly intact. No acute focal neurologic deficits are appreciated.  Skin:    Skin is warm, dry with a laceration as above, otherwise no significant findings ____________________________________________    LABS (pertinent positives/negatives) (all labs ordered are listed, but only abnormal results are displayed) Labs Reviewed - No data to display ____________________________________________   EKG    ____________________________________________    RADIOLOGY  No results found.  ____________________________________________   PROCEDURES .Marland KitchenLaceration Repair Date/Time: 09/25/2018 6:32 PM Performed by: Sharman Cheek, MD Authorized by: Sharman Cheek, MD   Consent:    Consent obtained:  Verbal and emergent situation   Consent given by:  Patient   Risks discussed:  Infection, pain, retained foreign body, poor cosmetic result, poor wound healing and need for additional repair   Alternatives discussed:  Referral Anesthesia (see MAR for exact dosages):    Anesthesia method:  Nerve block   Block location:  Periauricular   Block needle gauge:  25 G   Block anesthetic:  Lidocaine 1% w/o epi   Block technique:  Ring block   Block injection  procedure:  Anatomic landmarks identified, anatomic landmarks palpated, negative aspiration for blood, incremental injection and introduced needle   Block outcome:  Incomplete block Laceration details:    Location:  Ear   Ear location:  L ear   Length (cm):  3.5 Repair type:    Repair type:  Complex Pre-procedure details:    Preparation:  Patient was prepped and draped in usual sterile fashion Exploration:    Limited defect created (wound extended): no     Hemostasis achieved with:  Direct pressure   Wound exploration: entire depth of wound probed and visualized     Wound extent: no foreign bodies/material noted and no vascular damage noted     Contaminated: no   Treatment:    Area cleansed with:  Saline and Betadine   Amount of cleaning:  Extensive   Irrigation solution:  Sterile saline   Visualized foreign bodies/material removed: no     Undermining:  Minimal   Scar revision: no   Subcutaneous repair:    Suture size:  5-0   Wound subcutaneous closure material used: monocryl.   Suture technique:  Simple interrupted (to reapproximate fractured cartilage)   Number of sutures:  1 Skin repair:    Repair method:  Sutures   Suture size:  5-0   Wound skin closure material used: monocryl.   Suture technique:  Running   Number of sutures:  5 Approximation:    Approximation:  Close Post-procedure details:    Dressing:  Sterile dressing   Patient tolerance of procedure:  Tolerated well, no immediate complications    ____________________________________________    CLINICAL IMPRESSION / ASSESSMENT AND PLAN / ED COURSE  Pertinent labs & imaging results that were available during my care of the patient were reviewed by me and considered in my medical decision making (see chart for details).    Patient presents with left ear laceration after mechanical fall, wound was repaired, patient to be discharged home.  Follow-up with primary care in 2 days for wound recheck, monitor for  infection.      ____________________________________________   FINAL CLINICAL IMPRESSION(S) / ED DIAGNOSES    Final diagnoses:  Laceration of auricle of left ear, initial encounter     ED  Discharge Orders    None      Portions of this note were generated with dragon dictation software. Dictation errors may occur despite best attempts at proofreading.    Sharman Cheek, MD 09/25/18 [phone]

## 2018-10-21 ENCOUNTER — Emergency Department
Admission: EM | Admit: 2018-10-21 | Discharge: 2018-10-21 | Disposition: A | Discharge status: Home / Self Care | Attending: Emergency Medicine | Admitting: Emergency Medicine

## 2018-10-21 ENCOUNTER — Emergency Department

## 2018-10-21 ENCOUNTER — Other Ambulatory Visit: Payer: Self-pay

## 2018-10-21 DIAGNOSIS — Z8673 Personal history of transient ischemic attack (TIA), and cerebral infarction without residual deficits: Secondary | ICD-10-CM | POA: Diagnosis not present

## 2018-10-21 DIAGNOSIS — F4322 Adjustment disorder with anxiety: Secondary | ICD-10-CM | POA: Diagnosis not present

## 2018-10-21 DIAGNOSIS — I10 Essential (primary) hypertension: Secondary | ICD-10-CM | POA: Diagnosis not present

## 2018-10-21 DIAGNOSIS — Z7982 Long term (current) use of aspirin: Secondary | ICD-10-CM | POA: Insufficient documentation

## 2018-10-21 DIAGNOSIS — Z951 Presence of aortocoronary bypass graft: Secondary | ICD-10-CM | POA: Insufficient documentation

## 2018-10-21 DIAGNOSIS — Z79899 Other long term (current) drug therapy: Secondary | ICD-10-CM | POA: Diagnosis not present

## 2018-10-21 DIAGNOSIS — Z950 Presence of cardiac pacemaker: Secondary | ICD-10-CM | POA: Insufficient documentation

## 2018-10-21 DIAGNOSIS — Z7902 Long term (current) use of antithrombotics/antiplatelets: Secondary | ICD-10-CM | POA: Insufficient documentation

## 2018-10-21 DIAGNOSIS — R569 Unspecified convulsions: Secondary | ICD-10-CM | POA: Insufficient documentation

## 2018-10-21 DIAGNOSIS — E119 Type 2 diabetes mellitus without complications: Secondary | ICD-10-CM | POA: Diagnosis not present

## 2018-10-21 DIAGNOSIS — F0390 Unspecified dementia without behavioral disturbance: Secondary | ICD-10-CM | POA: Insufficient documentation

## 2018-10-21 DIAGNOSIS — Z87891 Personal history of nicotine dependence: Secondary | ICD-10-CM | POA: Insufficient documentation

## 2018-10-21 DIAGNOSIS — Z7984 Long term (current) use of oral hypoglycemic drugs: Secondary | ICD-10-CM | POA: Diagnosis not present

## 2018-10-21 LAB — BASIC METABOLIC PANEL
Anion gap: 9 (ref 5–15)
BUN: 12 mg/dL (ref 8–23)
CALCIUM: 9.3 mg/dL (ref 8.9–10.3)
CO2: 26 mmol/L (ref 22–32)
CREATININE: 0.95 mg/dL (ref 0.61–1.24)
Chloride: 101 mmol/L (ref 98–111)
Glucose, Bld: 127 mg/dL — ABNORMAL HIGH (ref 70–99)
Potassium: 4.8 mmol/L (ref 3.5–5.1)
SODIUM: 136 mmol/L (ref 135–145)

## 2018-10-21 LAB — CBC WITH DIFFERENTIAL/PLATELET
Abs Immature Granulocytes: 0.05 10*3/uL (ref 0.00–0.07)
BASOS ABS: 0 10*3/uL (ref 0.0–0.1)
Basophils Relative: 0 %
EOS PCT: 1 %
Eosinophils Absolute: 0.1 10*3/uL (ref 0.0–0.5)
HCT: 44.9 % (ref 39.0–52.0)
HEMOGLOBIN: 14.6 g/dL (ref 13.0–17.0)
Immature Granulocytes: 1 %
LYMPHS PCT: 22 %
Lymphs Abs: 2.3 10*3/uL (ref 0.7–4.0)
MCH: 29.6 pg (ref 26.0–34.0)
MCHC: 32.5 g/dL (ref 30.0–36.0)
MCV: 91.1 fL (ref 80.0–100.0)
Monocytes Absolute: 0.9 10*3/uL (ref 0.1–1.0)
Monocytes Relative: 8 %
NRBC: 0 % (ref 0.0–0.2)
Neutro Abs: 7.4 10*3/uL (ref 1.7–7.7)
Neutrophils Relative %: 68 %
Platelets: 202 10*3/uL (ref 150–400)
RBC: 4.93 MIL/uL (ref 4.22–5.81)
RDW: 13.3 % (ref 11.5–15.5)
WBC: 10.7 10*3/uL — AB (ref 4.0–10.5)

## 2018-10-21 LAB — VALPROIC ACID LEVEL: Valproic Acid Lvl: 24 ug/mL — ABNORMAL LOW (ref 50.0–100.0)

## 2018-10-21 MED ORDER — VALPROATE SODIUM 500 MG/5ML IV SOLN
10.0000 mg/kg | Freq: Once | INTRAVENOUS | Status: AC
  Administered 2018-10-21: 829 mg via INTRAVENOUS
  Filled 2018-10-21: qty 8.29

## 2018-10-21 MED ORDER — LORAZEPAM 2 MG/ML IJ SOLN
0.5000 mg | Freq: Once | INTRAMUSCULAR | Status: AC
  Administered 2018-10-21: 0.5 mg via INTRAVENOUS

## 2018-10-21 MED ORDER — DIVALPROEX SODIUM ER 250 MG PO TB24: 1000.0000 mg | ORAL_TABLET | Freq: Every day | ORAL | 0 refills | Status: AC

## 2018-10-21 MED ORDER — LORAZEPAM 2 MG/ML IJ SOLN
INTRAMUSCULAR | Status: AC
  Administered 2018-10-21: 0.5 mg via INTRAVENOUS
  Filled 2018-10-21: qty 1

## 2018-10-21 MED ORDER — SODIUM CHLORIDE 0.9 % IV SOLN
INTRAVENOUS | Status: DC | PRN
  Administered 2018-10-21: 250 mL via INTRAVENOUS

## 2018-10-21 MED ORDER — IPRATROPIUM-ALBUTEROL 0.5-2.5 (3) MG/3ML IN SOLN
3.0000 mL | Freq: Once | RESPIRATORY_TRACT | Status: AC
  Administered 2018-10-21: 3 mL via RESPIRATORY_TRACT
  Filled 2018-10-21: qty 3

## 2018-10-21 NOTE — ED Provider Notes (Signed)
[facility] Emergency Department Provider Note  ____________________________________________   First MD Initiated Contact with Patient 10/21/18 1546     (approximate)  I have reviewed the triage vital signs and the nursing notes.   HISTORY  Chief Complaint Seizures   HPI Anthony Thornton is a 82 y.o. male with a history of CVA, dementia and diabetes who is presenting to the emergency department today after having 3 seizures at [facility].  EMS reports that the last seizure was a generalized tonic-clonic seizure for approximately 30 seconds associated with a fall.  The patient does not have any complaints at this time he denies any pain.  He takes Depakote for seizure control.  I discussed the case with the patient's hospice nurse as well as wife.  The patient's wife, [name], states that the patient was placed on hospice about 1 month ago secondary to having failure to thrive.  She states that he also has a chronically contracted left upper extremity and left sided hemiparesis from a large stroke.  No reports of the patient's wife of the patient being ill lately.  Past Medical History:  Diagnosis Date  . Adjustment disorder with anxiety   . CVA (cerebral vascular accident) (HCC)   . Dementia (HCC)   . Diabetes mellitus without complication (HCC)   . Glaucoma   . Hypercholesteremia   . Hypertension   . Pacemaker   . PE (pulmonary thromboembolism) (HCC)   . Seizures [facility](HCC)     Patient Active Problem List   Diagnosis Date Noted  . Senile degeneration of brain (HCC) 05/26/2018  . Pneumonia 04/30/2018  . Acute bronchitis 02/20/2018  . 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 Vascular dementia (HCC) 08/23/2017  . Dementia (HCC) 05/13/2017  . Diabetes mellitus type 2, uncomplicated (HCC) 05/13/2017  . CAD (coronary artery disease) 05/12/2017  . Personal history of transient ischemic attack (TIA), and cerebral  infarction without residual deficits 06/25/2015  . Ataxia due to recent stroke 05/17/2014  . Hemiplegia affecting non-dominant side, post-stroke 05/17/2014  . Essential hypertension 03/25/2014  . Hyperlipidemia 03/25/2014  . Shortness of breath 03/25/2014    Past Surgical History:  Procedure Laterality Date  . AORTIC VALVE REPLACEMENT (AVR)/CORONARY ARTERY BYPASS GRAFTING (CABG)      Prior to Admission medications   Medication Sig Start Date End Date Taking? Authorizing Provider  acetaminophen (TYLENOL) 500 MG tablet Take 500 mg by mouth every 4 (four) hours as needed.    [provider]  albuterol (PROVENTIL HFA;VENTOLIN HFA) 108 (90 Base) MCG/ACT inhaler Inhale 2 puffs into the lungs 4 (four) times daily as needed for wheezing.     [provider]  aspirin 81 MG chewable tablet Chew 81 mg by mouth daily.    [provider]  atorvastatin (LIPITOR) 80 MG tablet Take 80 mg by mouth at bedtime.     [provider]  carvedilol (COREG) 3.125 MG tablet Take 3.125 mg by mouth as directed. Take 0.5 tab by oral route 2 times per day    [provider]  clopidogrel (PLAVIX) 75 MG tablet Take 75 mg by mouth at bedtime.     [provider]  divalproex (DEPAKOTE) 250 MG DR tablet Take 500 mg by mouth daily. At 0900    [provider]  donepezil (ARICEPT) 10 MG tablet Take 10 mg by mouth at bedtime.     [provider]  furosemide (LASIX) 20 MG  tablet Take 20 mg by mouth daily. 10/28/16   [provider]  guaiFENesin (MUCINEX) 600 MG 12 hr tablet Take 1 tablet (600 mg total) by mouth 2 (two) times daily as needed for to loosen phlegm. 05/02/18 05/02/19  Adrian Saran, MD  guaiFENesin (ROBITUSSIN) 100 MG/5ML liquid Take 200 mg by mouth 4 (four) times daily as needed for cough.    [provider]  lisinopril (PRINIVIL,ZESTRIL) 5 MG tablet Take 5 mg by mouth daily. 11/29/16   [provider]  loperamide (IMODIUM)  2 MG capsule Take 2 mg by mouth as needed for diarrhea or loose stools.    [provider]  LORazepam (ATIVAN) 1 MG tablet Take 0.5 tablets (0.5 mg total) by mouth daily as needed (agitation). 05/02/18   Adrian Saran, MD  meclizine (ANTIVERT) 12.5 MG tablet Take 12.5 mg by mouth 2 (two) times daily as needed for nausea.     [provider]  metFORMIN (GLUCOPHAGE) 500 MG tablet Take by mouth 2 (two) times daily with a meal.    [provider]  OLANZapine (ZYPREXA) 2.5 MG tablet Take 2.5 mg by mouth at bedtime.     [provider]  QUEtiapine (SEROQUEL) 25 MG tablet Take 25 mg by mouth 2 (two) times daily.    [provider]  Tiotropium Bromide Monohydrate 1.25 MCG/ACT AERS Inhale 1.25 mcg into the lungs every morning.     [provider]  traZODone (DESYREL) 50 MG tablet Take 75 mg by mouth at bedtime.     [provider]    Allergies Patient has no known allergies.  No family history on file.  Social History Social History   Tobacco Use  . Smoking status: Former Games developer  . Smokeless tobacco: Never Used  Substance Use Topics  . Alcohol use: No  . Drug use: No    Review of Systems  Constitutional: No fever/chills Eyes: No visual changes. ENT: No sore throat. Cardiovascular: Denies chest pain. Respiratory: Denies shortness of breath. Gastrointestinal: No abdominal pain.  No nausea, no vomiting.  No diarrhea.  No constipation. Genitourinary: Negative for dysuria. Musculoskeletal: Negative for back pain. Skin: Negative for rash. Neurological: Negative for headaches, focal weakness or numbness.  Exam confounded by patient's dementia.  However able to give simple yes or no answers to questions. ____________________________________________   PHYSICAL EXAM:  VITAL SIGNS: ED Triage Vitals  Enc Vitals Group     BP 10/21/18 1549 (!) 171/75     Pulse Rate 10/21/18 1549 84     Resp 10/21/18 1549 18     Temp 10/21/18 1549  (!) 97.3 F (36.3 C)     Temp Source 10/21/18 1549 Oral     SpO2 10/21/18 1549 98 %     Weight 10/21/18 1550 182 lb 12.8 oz (82.9 kg)     Height 10/21/18 1550 6\' 6"  (1.981 m)     Head Circumference --      Peak Flow --      Pain Score 10/21/18 1550 0     Pain Loc --      Pain Edu? --      Excl. in GC? --     Constitutional: Alert and in no acute distress. Eyes: Conjunctivae are normal.  Head: Atraumatic. Nose: No congestion/rhinnorhea. Mouth/Throat: Mucous membranes are moist.  Neck: No stridor.  No tenderness to palpation to the posterior cervical spine.  Patient ranges head and neck freely.  Cardiovascular: Normal rate, regular rhythm. Grossly normal heart  sounds.   Respiratory: Normal respiratory effort.  No retractions.  Mild wheezing throughout both fields. Gastrointestinal: Soft and nontender. No distention. No CVA tenderness. Musculoskeletal: No lower extremity tenderness nor edema.  No joint effusions.  No tenderness to the thoracic or lumbar spines.  No chest wall tenderness to palpation. Neurologic: Left-sided hemiparesis with left upper extremity that is contracted. Skin:  Skin is warm, dry and intact. No rash noted. Psychiatric: Mood and affect are normal. Speech and behavior are normal.  ____________________________________________   LABS (all labs ordered are listed, but only abnormal results are displayed)  Labs Reviewed  CBC WITH DIFFERENTIAL/PLATELET - Abnormal; Notable for the following components:      Result Value   WBC 10.7 (*)    All other components within normal limits  BASIC METABOLIC PANEL - Abnormal; Notable for the following components:   Glucose, Bld 127 (*)    All other components within normal limits  VALPROIC ACID LEVEL - Abnormal; Notable for the following components:   Valproic Acid Lvl 24 (*)    All other components within normal limits   ____________________________________________  EKG  ED ECG REPORT I, Arelia Longest, the  attending physician, personally viewed and interpreted this ECG.   Date: 10/21/2018  EKG Time: 1549  Rate: 79  Rhythm: normal sinus rhythm  Axis: Normal  Intervals:right bundle branch block and left anterior fascicular block  ST&T Change: No ST segment elevation or depression.  No abnormal T wave inversion.  ____________________________________________  RADIOLOGY  Chest x-ray without acute process. ____________________________________________   PROCEDURES  Procedure(s) performed:   Procedures  Critical Care performed:   ____________________________________________   INITIAL IMPRESSION / ASSESSMENT AND PLAN / ED COURSE  Pertinent labs & imaging results that were available during my care of the patient were reviewed by me and considered in my medical decision making (see chart for details).  DDX: Epilepsy, subtherapeutic level of Depakote, medication noncompliance, URI, bronchitis, wheezing As part of my medical decision making, I reviewed the following data within the electronic MEDICAL RECORD NUMBER Notes from prior ED visits  ----------------------------------------- 5:31 PM on 10/21/2018 -----------------------------------------  Discussed the case with the neurologist, Dr. Georg Ruddle.  Patient is currently on 500 mg of Depakote daily, extended release.  Still subtherapeutic.  I assume that he is getting all of these doses because he is at [facility].  Discussed case with neurology and we will load the patient with Depacon, 10 mg/kg.  We will also increase his dose to 4 tabs, daily.  ----------------------------------------- 6:48 PM on 10/21/2018 -----------------------------------------  Reassessed the patient.  He is resting, at this time.  Lungs are clear to auscultation.  I updated his wife, Ms. Basaldua regarding the IV Depakote load and the change in Depakote dose and discharged back to [facility].  I also updated the hospice nurse,  Aurther Loft. ____________________________________________   FINAL CLINICAL IMPRESSION(S) / ED DIAGNOSES  Seizure  NEW MEDICATIONS STARTED DURING THIS VISIT:  New Prescriptions   No medications on file     Note:  This document was prepared using Dragon voice recognition software and may include unintentional dictation errors.     Myrna Blazer, MD 10/21/18 [phone]

## 2018-10-21 NOTE — ED Notes (Signed)
XR in room at this time. °

## 2018-10-21 NOTE — ED Triage Notes (Signed)
Pt arrives via ems from  house, pt had 2 seizures earlier today and then again this afternoon, pt fell in the bathroom with the seizure according to staff. Pt is alert at this time, report was called ahead of time by Aurther Lofterry his hospice RN to notify staff of his fall and seizure activity today

## 2018-10-21 NOTE — ED Notes (Signed)
Pt unable to sign esignature due to altered mental status/Dementia.

## 2018-10-21 NOTE — ED Notes (Signed)
Pt threw legs over the edge of the bed, frequently shifting around in the bed, and biting on electrode cords and pulse ox cord. Pt reposition for CXR, CARE channel on, bed in lowest position, padded side rails.  Will continue to monitor. Respirations equal and unlabored at this time. VSS.

## 2018-10-21 NOTE — ED Notes (Signed)
Pt resting quietly in room.  

## 2018-10-21 NOTE — ED Notes (Signed)
Pt resting quietly at this time after receiving Ativan, respirations continue to be equal and unlabored.  VSS, will continue to monitor.  Pt covered up with warm blanket.

## 2019-03-09 DEATH — deceased

## 2019-10-01 IMAGING — CT CT HEAD W/O CM
3 of 5 series · 14 of 47 positions shown, 16 images · non-contrast
Comparison: 10/10/2017 head CT.

CLINICAL DATA: Unwitnessed fall.  Left facial laceration.

EXAM:
CT HEAD WITHOUT CONTRAST
TECHNIQUE: Contiguous axial images were obtained from the base of the skull
through the vertex without intravenous contrast.

[Series 2: head wo · axial · 0.47mm/px · z∈[-120,-15]mm · 8 of 29 slices shown, 10 images]
[im 4/29  brain]
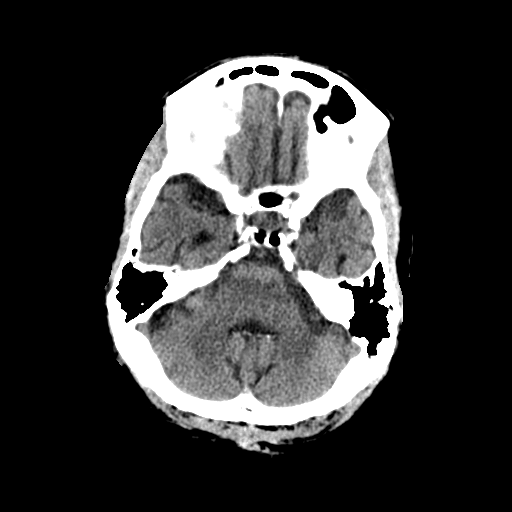
[im 4/29  bone]
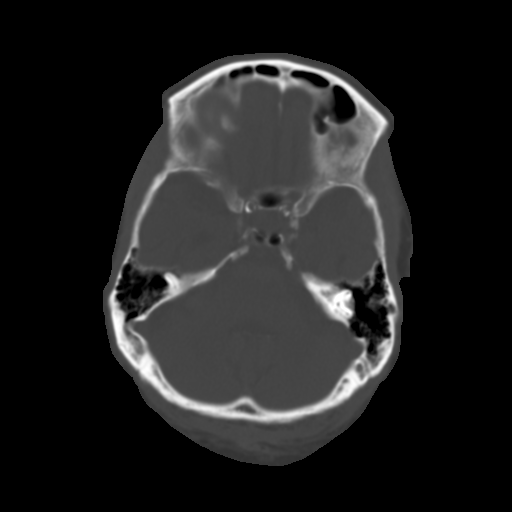
[im 7/29  brain]
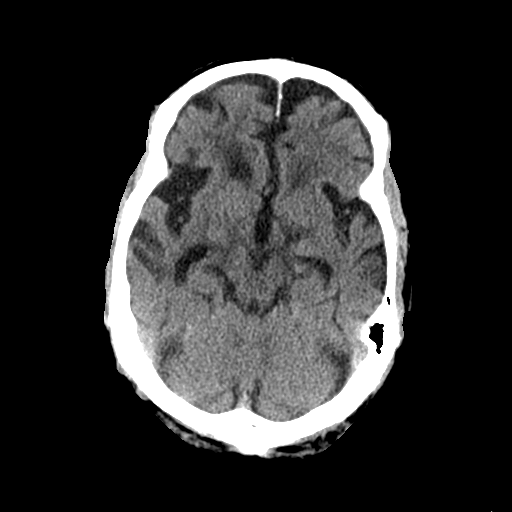
[im 10/29  brain]
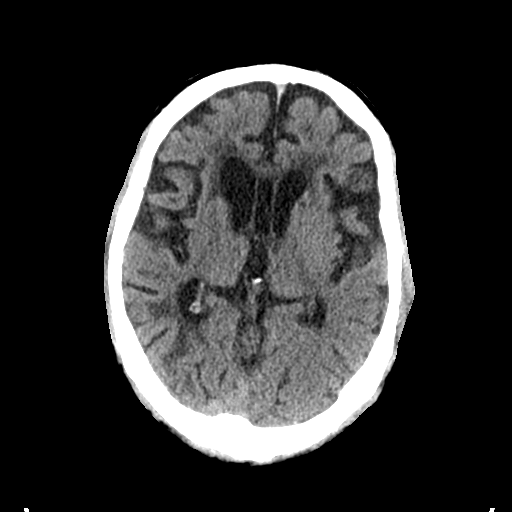
[im 13/29  brain]
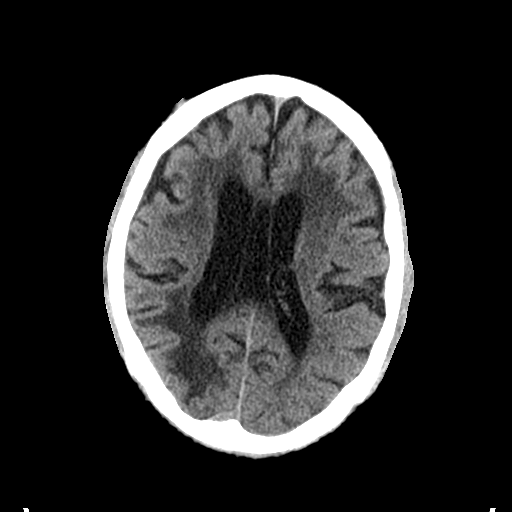
[im 16/29  brain]
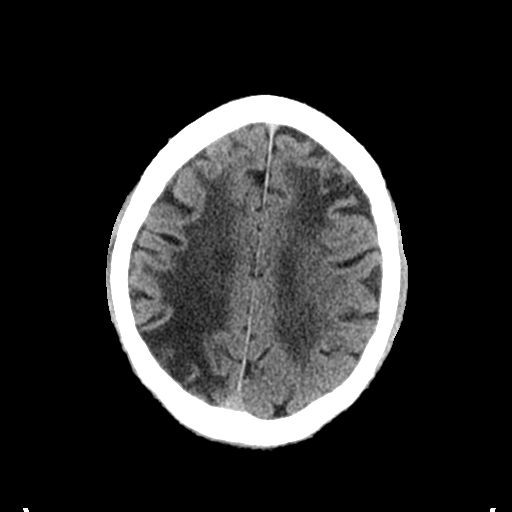
[im 16/29  bone]
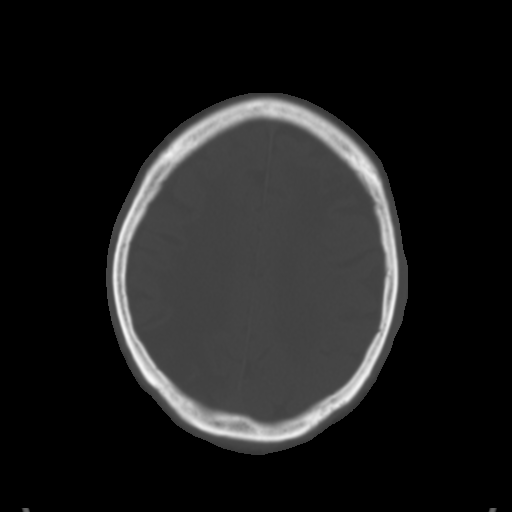
[im 19/29  brain]
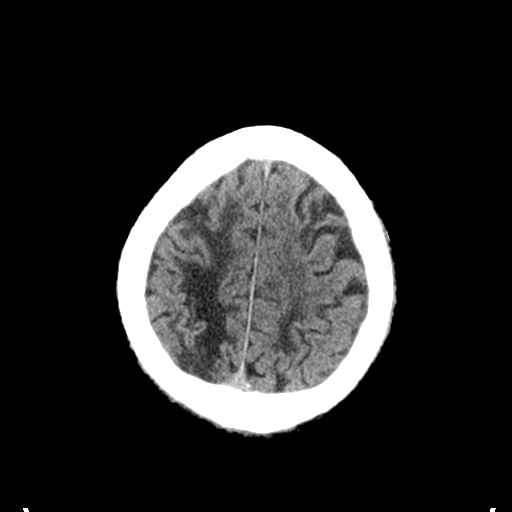
[im 22/29  brain]
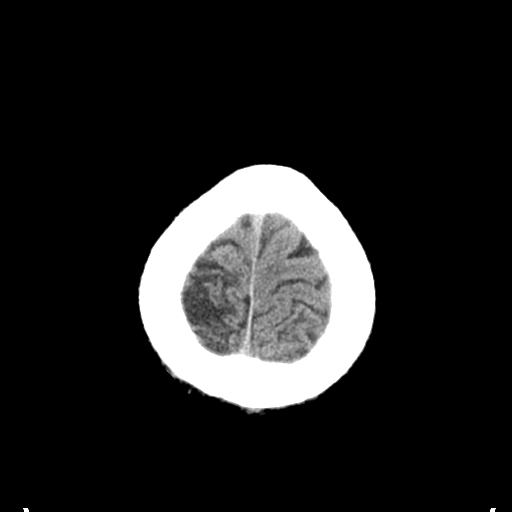
[im 25/29  brain]
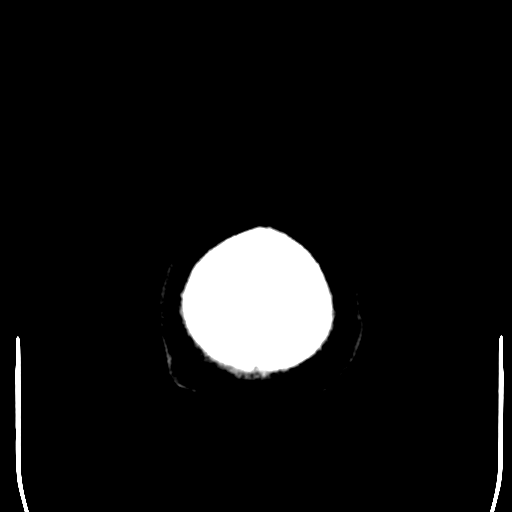

[Series 6: coronal soft tissue · coronal · 0.27mm/px · 3 of 70 slices shown]
[im 24/70  brain]
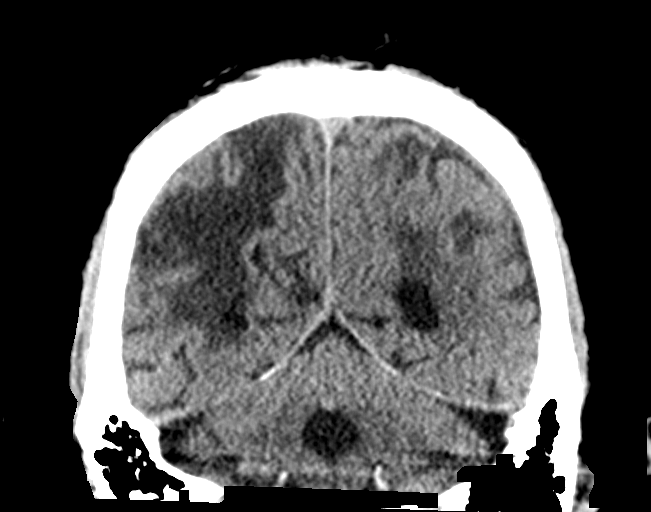
[im 31/70  brain]
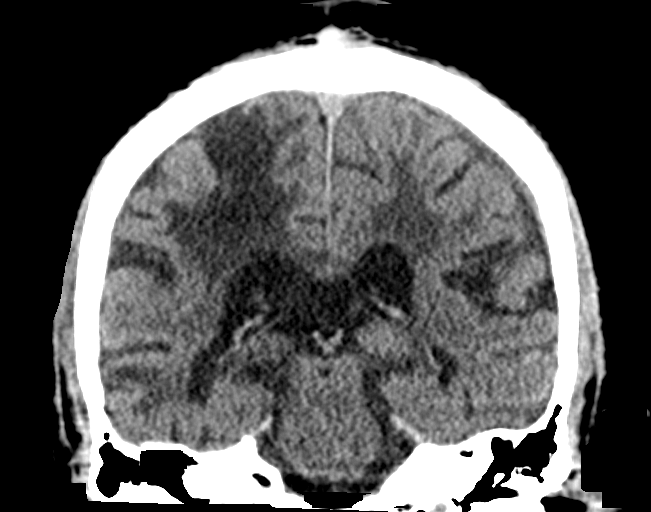
[im 39/70  brain]
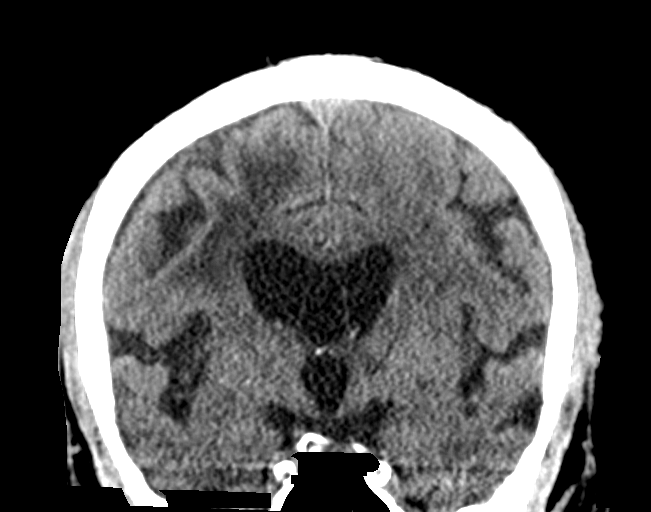

[Series 7: sagittal soft tissue · sagittal · 0.29mm/px · 3 of 55 slices shown]
[im 19/55  brain]
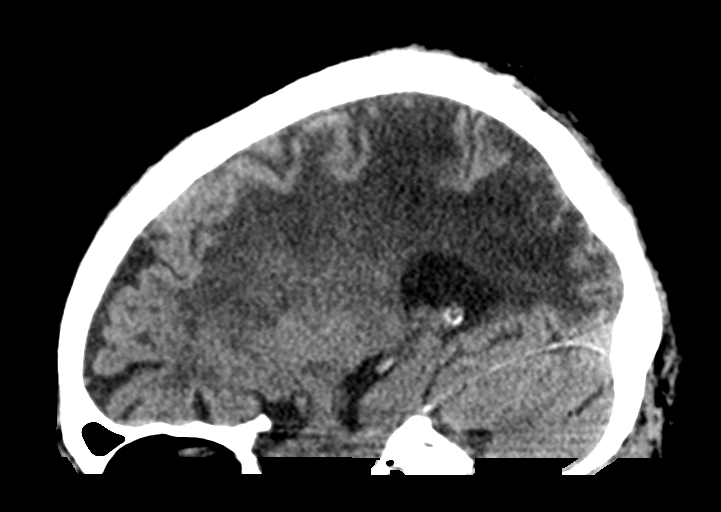
[im 28/55  brain]
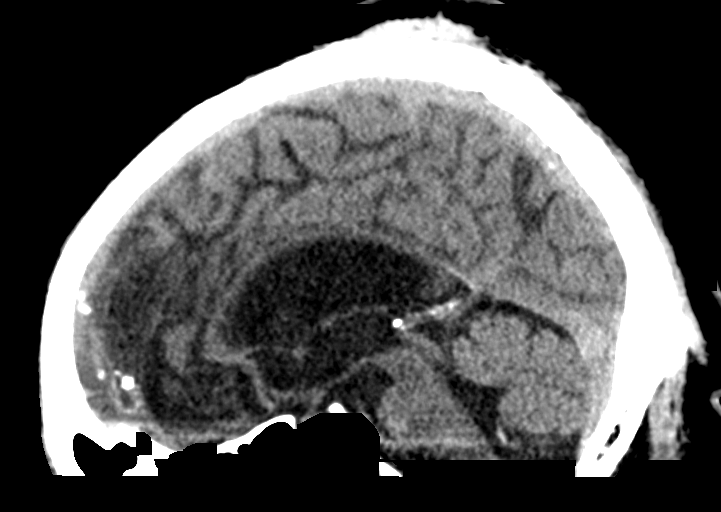
[im 37/55  brain]
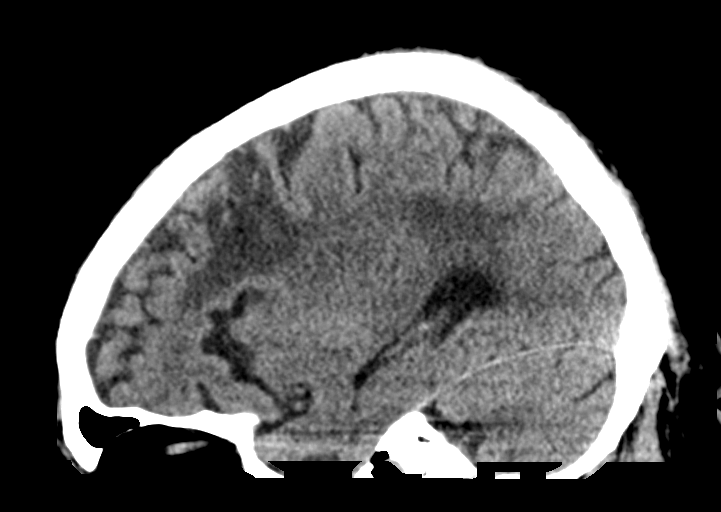

[14 of 47 positions shown; findings below may reference images not displayed]

FINDINGS: Brain: Generalized cerebral volume loss. Left frontal and right
parietal encephalomalacia is unchanged. Prominent chronic small
vessel ischemic changes throughout the cerebral white matter. No
evidence of acute parenchymal hemorrhage or acute extra-axial fluid
collection. Chronic tiny left cerebral convexity subdural hematoma
is decreased in the interval, now measuring 2 mm thickness,
previously 4 mm. No mass lesion, mass effect, or midline shift. No
CT evidence of acute infarction. Cavum septum pellucidum et vergae.
Cerebral volume is age appropriate. Stable ventricles with no acute
ventriculomegaly.

Vascular: No acute abnormality.

Skull: No evidence of calvarial fracture.

Sinuses/Orbits: No fluid levels. Mild mucoperiosteal thickening
throughout the visualized paranasal sinuses.

Other:  The mastoid air cells are unopacified.
IMPRESSION: 1. No evidence of acute intracranial abnormality. No evidence of
calvarial fracture.
2. Chronic tiny left cerebral convexity subdural hematoma is
decreased in the interval.
3. Generalized cerebral volume loss, left frontal and right parietal
encephalomalacia and chronic chronic small vessel ischemic changes
in the cerebral white matter.
4. Mild chronic appearing paranasal sinusitis.
